# Patient Record
Sex: Female | Born: 1975 | Race: White | Hispanic: No | Marital: Married | Smoking: Never smoker
Health system: Southern US, Community
[De-identification: ages and names within clinical notes are randomized; demographics above are authoritative.]

## PROBLEM LIST (undated history)

## (undated) DIAGNOSIS — K649 Unspecified hemorrhoids: Secondary | ICD-10-CM

## (undated) HISTORY — PX: TONSILLECTOMY: SUR1361

## (undated) HISTORY — PX: BLADDER SURGERY: SHX569

---

## 2010-02-19 ENCOUNTER — Ambulatory Visit: Payer: Self-pay | Admitting: Internal Medicine

## 2012-05-26 ENCOUNTER — Observation Stay: Payer: Self-pay

## 2012-05-28 ENCOUNTER — Inpatient Hospital Stay: Payer: Self-pay | Admitting: Obstetrics and Gynecology

## 2012-05-28 LAB — CBC WITH DIFFERENTIAL/PLATELET
Basophil #: 0.2 10*3/uL — ABNORMAL HIGH (ref 0.0–0.1)
Basophil %: 2.2 %
Eosinophil #: 0 10*3/uL (ref 0.0–0.7)
Eosinophil %: 0.3 %
HCT: 32.9 % — ABNORMAL LOW (ref 35.0–47.0)
HGB: 10.9 g/dL — ABNORMAL LOW (ref 12.0–16.0)
Lymphocyte #: 1.5 10*3/uL (ref 1.0–3.6)
Lymphocyte %: 14.7 %
MCHC: 33.2 g/dL (ref 32.0–36.0)
Monocyte %: 4.8 %
Neutrophil %: 78 %
Platelet: 201 10*3/uL (ref 150–440)
WBC: 10.2 10*3/uL (ref 3.6–11.0)

## 2012-05-30 LAB — HEMOGLOBIN: HGB: 10.4 g/dL — ABNORMAL LOW (ref 12.0–16.0)

## 2012-08-19 ENCOUNTER — Ambulatory Visit: Payer: Self-pay | Admitting: Obstetrics and Gynecology

## 2012-08-19 LAB — HEMOGLOBIN: HGB: 12.6 g/dL (ref 12.0–16.0)

## 2012-08-19 LAB — URINALYSIS, COMPLETE
Glucose,UR: NEGATIVE mg/dL (ref 0–75)
Nitrite: NEGATIVE
RBC,UR: 63 /HPF (ref 0–5)
Specific Gravity: 1.018 (ref 1.003–1.030)
WBC UR: 13 /HPF (ref 0–5)

## 2012-08-23 ENCOUNTER — Ambulatory Visit: Payer: Self-pay | Admitting: Obstetrics and Gynecology

## 2012-08-23 LAB — PREGNANCY, URINE: Pregnancy Test, Urine: NEGATIVE m[IU]/mL

## 2012-08-28 LAB — WOUND CULTURE

## 2014-03-28 DIAGNOSIS — F32A Depression, unspecified: Secondary | ICD-10-CM | POA: Insufficient documentation

## 2014-04-11 ENCOUNTER — Ambulatory Visit: Payer: Self-pay

## 2015-04-02 DIAGNOSIS — N939 Abnormal uterine and vaginal bleeding, unspecified: Secondary | ICD-10-CM | POA: Insufficient documentation

## 2015-04-02 DIAGNOSIS — A630 Anogenital (venereal) warts: Secondary | ICD-10-CM | POA: Insufficient documentation

## 2015-04-17 NOTE — Op Note (Signed)
PATIENT NAME:  Kathy ScottsCUNIO, Myrl R MR#:  [id number] DATE OF BIRTH:  09/08/1976  DATE OF PROCEDURE:  08/23/2012  PREOPERATIVE DIAGNOSIS: Perineal abscess with sinus tract to right gluteus.   POSTOPERATIVE DIAGNOSIS: Perineal abscess with sinus tract to right gluteus.   PROCEDURE: Excision of abscess with sinus tract.   SURGEON: Ricky L. Logan BoresEvans, M.D.   ANESTHESIA: General orotracheal.   FINDINGS: Abscess with connection from just past the midline to the left side of the posterior fourchette connecting to an area of the right buttock approximately 5 cm from the midline.   ESTIMATED BLOOD LOSS: 50.   COMPLICATIONS: None.   SPECIMENS: Abscess including sinus tract.   DRAINS: None.   COMPLICATIONS: None.   PROCEDURE IN DETAIL: The patient had an area of the right gluteus that appeared to be an abscess. It was actually drained by Dr. Feliberto GottronSchermerhorn in May.  She persisted and now at several months postpartum she has continuous draining in this area. Pressure at the posterior fourchette leads to expression of pustular material through the right buttock wound. The entire length of the tract was approximately 7 cm. Discussed options and recommended excision in the operating room. Consent was signed. Preoperative antibiotics were given. She was taken to the operating room  and placed in the supine position where anesthesia was initiated and placed in the dorsal lithotomy position using Allen stirrups, prepped and draped in the usual sterile fashion. A #15 blade was used to make an approximate 4-mm vertical incision through the opening of the gluteal portion of the sinus and a duct probe was used to trace the tract to the left of midline.  Incision was made through the posterior vaginal perineal midline and carried down to the level of the probe. A pocket to the left of the probe was grasped at its base with a hemostat, everted, and excised with a scalpel. Oozing was controlled with cautery.  The sinus tract  from the midline to the right gluteus was then excised using Metzenbaum scissors and a #11 blade.  This was all sent as sinus tract/abscess. Excessive mattress sutures were placed to close the dead space and the posterior peritoneum was closed with running in the usual fashion, all using 3-0 Vicryl.  Dermabond was used to close the buttock incision.   All needle, instrument, and sponge counts were correct.  The patient tolerated the procedure well.  Hemostasis and cosmesis were good.  I anticipate a routine postoperative course. Cultures were taken and I will have the patient take doxycycline once a day for five more days.    ____________________________ Reatha Harpsicky L. Logan BoresEvans, MD rle:bjt D: 08/23/2012 09:42:54 ET T: 08/23/2012 11:26:34 ET JOB#: [id number]  cc: Clide Clifficky L. Logan BoresEvans, MD, <Dictator> Augustina MoodICK L Esaias Cleavenger MD ELECTRONICALLY SIGNED 08/24/2012 9:19

## 2015-10-06 ENCOUNTER — Emergency Department
Admission: EM | Admit: 2015-10-06 | Discharge: 2015-10-06 | Disposition: A | Discharge status: Home / Self Care | Payer: No Typology Code available for payment source | Attending: Emergency Medicine | Admitting: Emergency Medicine

## 2015-10-06 ENCOUNTER — Encounter: Payer: Self-pay | Admitting: Emergency Medicine

## 2015-10-06 DIAGNOSIS — H53149 Visual discomfort, unspecified: Secondary | ICD-10-CM | POA: Diagnosis not present

## 2015-10-06 DIAGNOSIS — R51 Headache: Secondary | ICD-10-CM | POA: Diagnosis present

## 2015-10-06 DIAGNOSIS — R519 Headache, unspecified: Secondary | ICD-10-CM

## 2015-10-06 HISTORY — DX: Unspecified hemorrhoids: K64.9

## 2015-10-06 LAB — CBC WITH DIFFERENTIAL/PLATELET
BASOS ABS: 0 10*3/uL (ref 0–0.1)
BASOS PCT: 0 %
EOS ABS: 0 10*3/uL (ref 0–0.7)
Eosinophils Relative: 0 %
HEMATOCRIT: 38.2 % (ref 35.0–47.0)
HEMOGLOBIN: 12.5 g/dL (ref 12.0–16.0)
Lymphocytes Relative: 17 %
Lymphs Abs: 2.1 10*3/uL (ref 1.0–3.6)
MCH: 27.6 pg (ref 26.0–34.0)
MCHC: 32.7 g/dL (ref 32.0–36.0)
MCV: 84.6 fL (ref 80.0–100.0)
MONO ABS: 0.6 10*3/uL (ref 0.2–0.9)
MONOS PCT: 5 %
NEUTROS ABS: 9.9 10*3/uL — AB (ref 1.4–6.5)
NEUTROS PCT: 78 %
Platelets: 301 10*3/uL (ref 150–440)
RBC: 4.52 MIL/uL (ref 3.80–5.20)
RDW: 14.4 % (ref 11.5–14.5)
WBC: 12.7 10*3/uL — ABNORMAL HIGH (ref 3.6–11.0)

## 2015-10-06 LAB — BASIC METABOLIC PANEL
ANION GAP: 6 (ref 5–15)
BUN: 13 mg/dL (ref 6–20)
CALCIUM: 8.8 mg/dL — AB (ref 8.9–10.3)
CO2: 26 mmol/L (ref 22–32)
CREATININE: 0.77 mg/dL (ref 0.44–1.00)
Chloride: 106 mmol/L (ref 101–111)
Glucose, Bld: 103 mg/dL — ABNORMAL HIGH (ref 65–99)
Potassium: 4 mmol/L (ref 3.5–5.1)
Sodium: 138 mmol/L (ref 135–145)

## 2015-10-06 MED ORDER — METOCLOPRAMIDE HCL 5 MG/ML IJ SOLN
10.0000 mg | Freq: Once | INTRAMUSCULAR | Status: AC
  Administered 2015-10-06: 10 mg via INTRAVENOUS
  Filled 2015-10-06: qty 2

## 2015-10-06 MED ORDER — KETOROLAC TROMETHAMINE 30 MG/ML IJ SOLN
30.0000 mg | Freq: Once | INTRAMUSCULAR | Status: AC
  Administered 2015-10-06: 30 mg via INTRAVENOUS
  Filled 2015-10-06: qty 1

## 2015-10-06 MED ORDER — SODIUM CHLORIDE 0.9 % IV BOLUS (SEPSIS)
1000.0000 mL | Freq: Once | INTRAVENOUS | Status: AC
  Administered 2015-10-06: 1000 mL via INTRAVENOUS

## 2015-10-06 MED ORDER — OXYCODONE-ACETAMINOPHEN 5-325 MG PO TABS
1.0000 | ORAL_TABLET | Freq: Once | ORAL | Status: AC
  Administered 2015-10-06: 1 via ORAL
  Filled 2015-10-06: qty 1

## 2015-10-06 NOTE — ED Provider Notes (Signed)
[facility] Emergency Department Provider Note   ____________________________________________  Time seen:  I have reviewed the triage vital signs and the triage nursing note.  HISTORY  Chief Complaint Influenza and Headache   Historian Patient and husband  HPI Kathy Webb is a 39 y.o. female who is here for evaluation of headache. She has had "migraines" before but usually they go away taking medication and resting at home in the dark. She woke up with a headache on Friday morning which was moderate and it seemed to intensify throughout the day and it now been there constantly since Friday morning. Pain is currently 6 out of 10. She is nauseated. She has had 6 episodes of vomiting over the past 24 hours. Patient works at a daycare. Patient had a flu shot on Thursday and then had nausea vomiting and diarrhea for 24 hours. Last vomiting was 3 AM this morning. She is having photophobia. Headache is global but mostly located behind the eyes in the frontal area. No neck stiffness. No fever. No weakness, numbness, confusion or altered mental status. No seizures.    Past Medical History  Diagnosis Date  . Hemorrhoids     There are no active problems to display for this patient.   History reviewed. No pertinent past surgical history.  No current outpatient prescriptions on file.  Allergies Claritin  History reviewed. No pertinent family history.  Social History Social History  Substance Use Topics  . Smoking status: Never Smoker   . Smokeless tobacco: Never Used  . Alcohol Use: Yes     Comment: OCcassionally    Review of Systems  Constitutional: Negative for fever. Eyes: Negative for visual changes. ENT: Negative for sore throat. Cardiovascular: Negative for chest pain. Respiratory: Negative for shortness of breath. Gastrointestinal: Negative for abdominal pain. Genitourinary: Negative for dysuria. Musculoskeletal: Negative for back  pain. Skin: Negative for rash. Neurological: Negative for altered mental status 10 point Review of Systems otherwise negative ____________________________________________   PHYSICAL EXAM:  VITAL SIGNS: ED Triage Vitals  Enc Vitals Group     BP 10/06/15 0913 122/77 mmHg     Pulse Rate 10/06/15 0913 89     Resp 10/06/15 0913 16     Temp 10/06/15 0913 98.1 F (36.7 C)     Temp Source 10/06/15 0913 Oral     SpO2 10/06/15 0913 97 %     Weight 10/06/15 0913 240 lb (108.863 kg)     Height 10/06/15 0913  (1.727 m)     Head Cir --      Peak Flow --      Pain Score 10/06/15 0921 10     Pain Loc --      Pain Edu? --      Excl. in GC? --      Constitutional: Alert and oriented. Well appearing and in no distress. Eyes: Conjunctivae are normal. PERRL. Normal extraocular movements. positive photophobia. Funduscopic exam limited due to pinpoint pupils, however visualize fundi normal. ENT   Head: Normocephalic and atraumatic.   Nose: No congestion/rhinnorhea.   Mouth/Throat: Mucous membranes are moist.   Neck: No stridor. Cardiovascular/Chest: Normal rate, regular rhythm.  No murmurs, rubs, or gallops. Respiratory: Normal respiratory effort without tachypnea nor retractions. Breath sounds are clear and equal bilaterally. No wheezes/rales/rhonchi. Gastrointestinal: Soft. No distention, no guarding, no rebound. Nontender   Genitourinary/rectal:Deferred Musculoskeletal: Nontender with normal range of motion in all extremities. No joint effusions.  No lower extremity tenderness.  No edema.  Neurologic:  Normal speech and language. No gross or focal neurologic deficits are appreciated. Skin:  Skin is warm, dry and intact. No rash noted. Psychiatric: Mood and affect are normal. Speech and behavior are normal. Patient exhibits appropriate insight and judgment.  ____________________________________________   EKG I, Governor Rooks, MD, the attending physician have personally  viewed and interpreted all ECGs.  No EKG performed ____________________________________________  LABS (pertinent positives/negatives)  White blood count 12.7, hemoglobin 12.5, platelet count 301 Basic metabolic panel without significant abnormality  ____________________________________________  RADIOLOGY All Xrays were viewed by me. Imaging interpreted by Radiologist.  none __________________________________________  PROCEDURES  Procedure(s) performed: None  Critical Care performed: None  ____________________________________________   ED COURSE / ASSESSMENT AND PLAN  CONSULTATIONS: None  Pertinent labs & imaging results that were available during my care of the patient were reviewed by me and considered in my medical decision making (see chart for details).   Patient is here with stable vital signs being evaluated for nonspecific headache. Clinical symptoms do not sound highly concerning for intracranial abnormality such as stroke, bleed, tumor, or subarachnoid hemorrhage. I discussed with the patient risks versus benefit of CT scan today, and we chose to hold off. We discussed that if she has recurrent or worsening headache, or certainly any neurologic deficit she is to return to the emergency department for continued evaluation and possible imaging. We discussed she should follow with primary care physician, and they may discuss MRI imaging if indicated at that point time.  ----------------------------------------- 3:46 PM on 10/06/2015 -----------------------------------------  Patient does have a minimally elevated white blood cell count, however she is not having any meningismus, I do not suspect meningitis. Her headache feels much better after symptomatic medications of Reglan, Toradol, and Zofran as well as IV fluids. Patient is stable for discharge and outpatient follow-up.  Patient / Family / Caregiver informed of clinical course, medical decision-making process,  and agree with plan.   I discussed return precautions, follow-up instructions, and discharged instructions with patient and/or family.  ___________________________________________   FINAL CLINICAL IMPRESSION(S) / ED DIAGNOSES   Final diagnoses:  Headache, unspecified headache type       Governor Rooks, MD 10/06/15 1547

## 2015-10-06 NOTE — Discharge Instructions (Signed)
You were evaluated for headache and no certain cause was found, however your exam and evaluation are reassuring. We discussed holding off on head CT today given your normal neurologic exam. Return to the emergency department for any worsening condition including worsening headache, weakness, numbness, fever, confusion or altered mental status, seizure, or any other symptoms concerning to you.   General Headache Without Cause A headache is pain or discomfort felt around the head or neck area. There are many causes and types of headaches. In some cases, the cause may not be found.  HOME CARE  Managing Pain  Take over-the-counter and prescription medicines only as told by your doctor.  Lie down in a dark, quiet room when you have a headache.  If directed, apply ice to the head and neck area:  Put ice in a plastic bag.  Place a towel between your skin and the bag.  Leave the ice on for 20 minutes, 2-3 times per day.  Use a heating pad or hot shower to apply heat to the head and neck area as told by your doctor.  Keep lights dim if bright lights bother you or make your headaches worse. Eating and Drinking  Eat meals on a regular schedule.  Lessen how much alcohol you drink.  Lessen how much caffeine you drink, or stop drinking caffeine. General Instructions  Keep all follow-up visits as told by your doctor. This is important.  Keep a journal to find out if certain things bring on headaches. For example, write down:  What you eat and drink.  How much sleep you get.  Any change to your diet or medicines.  Relax by getting a massage or doing other relaxing activities.  Lessen stress.  Sit up straight. Do not tighten (tense) your muscles.  Do not use tobacco products. This includes cigarettes, chewing tobacco, or e-cigarettes. If you need help quitting, ask your doctor.  Exercise regularly as told by your doctor.  Get enough sleep. This often means 7-9 hours of sleep. GET  HELP IF:  Your symptoms are not helped by medicine.  You have a headache that feels different than the other headaches.  You feel sick to your stomach (nauseous) or you throw up (vomit).  You have a fever. GET HELP RIGHT AWAY IF:   Your headache becomes really bad.  You keep throwing up.  You have a stiff neck.  You have trouble seeing.  You have trouble speaking.  You have pain in the eye or ear.  Your muscles are weak or you lose muscle control.  You lose your balance or have trouble walking.  You feel like you will pass out (faint) or you pass out.  You have confusion.   This information is not intended to replace advice given to you by your health care provider. Make sure you discuss any questions you have with your health care provider.   Document Released: 09/23/2008 Document Revised: 09/05/2015 Document Reviewed: 04/09/2015 Elsevier Interactive Patient Education [employer]

## 2015-10-06 NOTE — ED Notes (Signed)
Pt states she got the flu shot on Thursday, and on Friday pt had N/V/D and a HA. Pt states light sensitivity. Pt states 6 episodes of vomiting in last 24 hrs. Pt states she is unable to keep fluids or food down at this time. States she was unable to sleep due to the HA.

## 2015-10-06 NOTE — ED Notes (Signed)
Pt reports frontal headache for the past 2 days with light sensitivity . With increased pain when bending forward. Pt also reports getting her flu shot Thursday and nausea vomiting and diarrhea since yesterday. Last episode of vomiting at 0300 this morning.

## 2017-05-12 ENCOUNTER — Other Ambulatory Visit: Payer: Self-pay | Admitting: Obstetrics and Gynecology

## 2017-05-12 DIAGNOSIS — Z1231 Encounter for screening mammogram for malignant neoplasm of breast: Secondary | ICD-10-CM

## 2017-05-29 ENCOUNTER — Ambulatory Visit
Admission: RE | Admit: 2017-05-29 | Discharge: 2017-05-29 | Disposition: A | Discharge status: Home / Self Care | Payer: PRIVATE HEALTH INSURANCE | Source: Ambulatory Visit | Attending: Obstetrics and Gynecology | Admitting: Obstetrics and Gynecology

## 2017-05-29 DIAGNOSIS — Z1231 Encounter for screening mammogram for malignant neoplasm of breast: Secondary | ICD-10-CM | POA: Insufficient documentation

## 2017-06-03 DIAGNOSIS — G43109 Migraine with aura, not intractable, without status migrainosus: Secondary | ICD-10-CM | POA: Insufficient documentation

## 2017-07-02 ENCOUNTER — Emergency Department: Payer: PRIVATE HEALTH INSURANCE

## 2017-07-02 ENCOUNTER — Emergency Department
Admission: EM | Admit: 2017-07-02 | Discharge: 2017-07-02 | Disposition: A | Discharge status: Home / Self Care | Payer: PRIVATE HEALTH INSURANCE | Attending: Emergency Medicine | Admitting: Emergency Medicine

## 2017-07-02 ENCOUNTER — Encounter: Payer: Self-pay | Admitting: *Deleted

## 2017-07-02 DIAGNOSIS — K649 Unspecified hemorrhoids: Secondary | ICD-10-CM | POA: Diagnosis not present

## 2017-07-02 DIAGNOSIS — R197 Diarrhea, unspecified: Secondary | ICD-10-CM | POA: Diagnosis not present

## 2017-07-02 DIAGNOSIS — Z975 Presence of (intrauterine) contraceptive device: Secondary | ICD-10-CM | POA: Insufficient documentation

## 2017-07-02 DIAGNOSIS — Z79899 Other long term (current) drug therapy: Secondary | ICD-10-CM | POA: Insufficient documentation

## 2017-07-02 DIAGNOSIS — N83201 Unspecified ovarian cyst, right side: Secondary | ICD-10-CM | POA: Insufficient documentation

## 2017-07-02 DIAGNOSIS — N939 Abnormal uterine and vaginal bleeding, unspecified: Secondary | ICD-10-CM | POA: Diagnosis not present

## 2017-07-02 DIAGNOSIS — K922 Gastrointestinal hemorrhage, unspecified: Secondary | ICD-10-CM | POA: Insufficient documentation

## 2017-07-02 DIAGNOSIS — R102 Pelvic and perineal pain: Secondary | ICD-10-CM

## 2017-07-02 DIAGNOSIS — K625 Hemorrhage of anus and rectum: Secondary | ICD-10-CM | POA: Diagnosis present

## 2017-07-02 DIAGNOSIS — R1030 Lower abdominal pain, unspecified: Secondary | ICD-10-CM | POA: Insufficient documentation

## 2017-07-02 DIAGNOSIS — R112 Nausea with vomiting, unspecified: Secondary | ICD-10-CM | POA: Diagnosis not present

## 2017-07-02 LAB — URINALYSIS, COMPLETE (UACMP) WITH MICROSCOPIC
Bilirubin Urine: NEGATIVE
GLUCOSE, UA: NEGATIVE mg/dL
KETONES UR: NEGATIVE mg/dL
Nitrite: NEGATIVE
PH: 6 (ref 5.0–8.0)
Protein, ur: NEGATIVE mg/dL
Specific Gravity, Urine: 1.001 — ABNORMAL LOW (ref 1.005–1.030)

## 2017-07-02 LAB — CBC
HCT: 41.9 % (ref 35.0–47.0)
HEMOGLOBIN: 14.1 g/dL (ref 12.0–16.0)
MCH: 29 pg (ref 26.0–34.0)
MCHC: 33.7 g/dL (ref 32.0–36.0)
MCV: 86.1 fL (ref 80.0–100.0)
PLATELETS: 361 10*3/uL (ref 150–440)
RBC: 4.86 MIL/uL (ref 3.80–5.20)
RDW: 13.4 % (ref 11.5–14.5)
WBC: 17.3 10*3/uL — AB (ref 3.6–11.0)

## 2017-07-02 LAB — COMPREHENSIVE METABOLIC PANEL
ALT: 17 U/L (ref 14–54)
ANION GAP: 9 (ref 5–15)
AST: 20 U/L (ref 15–41)
Albumin: 4.2 g/dL (ref 3.5–5.0)
Alkaline Phosphatase: 73 U/L (ref 38–126)
BUN: 15 mg/dL (ref 6–20)
CHLORIDE: 104 mmol/L (ref 101–111)
CO2: 25 mmol/L (ref 22–32)
CREATININE: 0.79 mg/dL (ref 0.44–1.00)
Calcium: 9.4 mg/dL (ref 8.9–10.3)
Glucose, Bld: 118 mg/dL — ABNORMAL HIGH (ref 65–99)
POTASSIUM: 4.3 mmol/L (ref 3.5–5.1)
SODIUM: 138 mmol/L (ref 135–145)
Total Bilirubin: 0.7 mg/dL (ref 0.3–1.2)
Total Protein: 7.7 g/dL (ref 6.5–8.1)

## 2017-07-02 LAB — TYPE AND SCREEN
ABO/RH(D): A POS
Antibody Screen: NEGATIVE

## 2017-07-02 LAB — WET PREP, GENITAL
CLUE CELLS WET PREP: NONE SEEN
Sperm: NONE SEEN
Trich, Wet Prep: NONE SEEN
Yeast Wet Prep HPF POC: NONE SEEN

## 2017-07-02 LAB — CHLAMYDIA/NGC RT PCR (ARMC ONLY)
CHLAMYDIA TR: NOT DETECTED
N gonorrhoeae: NOT DETECTED

## 2017-07-02 LAB — POC URINE PREG, ED: PREG TEST UR: NEGATIVE

## 2017-07-02 MED ORDER — OXYCODONE-ACETAMINOPHEN 5-325 MG PO TABS: 1.0000 | ORAL_TABLET | Freq: Four times a day (QID) | ORAL | 0 refills | Status: DC | PRN

## 2017-07-02 MED ORDER — ONDANSETRON HCL 4 MG/2ML IJ SOLN
4.0000 mg | Freq: Once | INTRAMUSCULAR | Status: AC
  Administered 2017-07-02: 4 mg via INTRAVENOUS
  Filled 2017-07-02: qty 2

## 2017-07-02 MED ORDER — KETOROLAC TROMETHAMINE 30 MG/ML IJ SOLN
30.0000 mg | Freq: Once | INTRAMUSCULAR | Status: AC
  Administered 2017-07-02: 30 mg via INTRAVENOUS
  Filled 2017-07-02: qty 1

## 2017-07-02 MED ORDER — IOPAMIDOL (ISOVUE-300) INJECTION 61%
30.0000 mL | Freq: Once | INTRAVENOUS | Status: AC | PRN
  Administered 2017-07-02: 30 mL via ORAL

## 2017-07-02 MED ORDER — OXYCODONE-ACETAMINOPHEN 5-325 MG PO TABS
2.0000 | ORAL_TABLET | Freq: Once | ORAL | Status: DC
  Filled 2017-07-02: qty 2

## 2017-07-02 MED ORDER — SODIUM CHLORIDE 0.9 % IV BOLUS (SEPSIS)
1000.0000 mL | Freq: Once | INTRAVENOUS | Status: AC
  Administered 2017-07-02: 1000 mL via INTRAVENOUS

## 2017-07-02 MED ORDER — HYDROCORTISONE ACETATE 25 MG RE SUPP: 25.0000 mg | Freq: Two times a day (BID) | RECTAL | 1 refills | Status: AC

## 2017-07-02 MED ORDER — IOPAMIDOL (ISOVUE-300) INJECTION 61%
100.0000 mL | Freq: Once | INTRAVENOUS | Status: AC | PRN
  Administered 2017-07-02: 100 mL via INTRAVENOUS

## 2017-07-02 NOTE — ED Provider Notes (Signed)
Labs Reviewed  WET PREP, GENITAL - Abnormal; Notable for the following:       Result Value   WBC, Wet Prep HPF POC FEW (*)    All other components within normal limits  COMPREHENSIVE METABOLIC PANEL - Abnormal; Notable for the following:    Glucose, Bld 118 (*)    All other components within normal limits  CBC - Abnormal; Notable for the following:    WBC 17.3 (*)    All other components within normal limits  URINALYSIS, COMPLETE (UACMP) WITH MICROSCOPIC - Abnormal; Notable for the following:    Color, Urine COLORLESS (*)    APPearance CLEAR (*)    Specific Gravity, Urine 1.001 (*)    Hgb urine dipstick MODERATE (*)    Leukocytes, UA SMALL (*)    Bacteria, UA RARE (*)    Squamous Epithelial / LPF 0-5 (*)    All other components within normal limits  CHLAMYDIA/NGC RT PCR ([facility] ONLY)  GASTROINTESTINAL PANEL BY PCR, STOOL (REPLACES STOOL CULTURE)  C DIFFICILE QUICK SCREEN W PCR REFLEX  POC URINE PREG, ED  POC OCCULT BLOOD, ED  TYPE AND SCREEN   Vitals:   07/02/17 1730 07/02/17 1754  BP: (!) 126/92   Pulse: 65 72  Resp:    Temp:     IMPRESSION: 5.9 cm simple appearing right ovarian cyst without free fluid or evidence of ovarian torsion. This is almost certainly benign, but follow up ultrasound is recommended in 1 year according to the Society of Radiologists in Ultrasound 2010 Consensus Conference Statement (D Lenis NoonLevine et al. Management of Asymptomatic Ovarian and Other Adnexal Cysts Imaged at US: Society of Radiologists in Ultrasound Consensus Conference Statement 2010. Radiology 256 (Sept 2010): 943-954.).  IUD which appears in appropriate position.  No other significant abnormalities. Patient will be discharged with pain medicine, Anusol HC suppositories. She'll be referred to Dr. Renda RollsWilton Smith for outpatient follow-up.    Emily FilbertWilliams, Jonathan E, MD 07/02/17 [phone]

## 2017-07-02 NOTE — ED Triage Notes (Signed)
Pt reports having a new onset of bright red rectal bleeding. Pt reports beginning yesterday she has started passing "blood and tissue" Pt reports she had a hemorrhoid that burst last week but the bleeding had stopped until yesterday. Pt also reports having groin pain this week with nausea and vomiting yesterday. Pt has hx of fissures and hemorrhoids as well as polyps removed.   Pt also reports having been dx with UTI last week but is still felling sore and having increased urination. No fevers reported but pt verbalized having chills.

## 2017-07-02 NOTE — ED Notes (Signed)
[employer] not allowing this nurse to print labels for urine specimen.  Lab notified and okayed to send with white label.

## 2017-07-02 NOTE — ED Triage Notes (Signed)
FIRST NURSE NOTE-ambulatory to triage. Skin color WNL. NAD

## 2017-07-02 NOTE — ED Provider Notes (Addendum)
[facility] Emergency Department Provider Note  ____________________________________________   First MD Initiated Contact with Patient 07/02/17 1246     (approximate)  I have reviewed the triage vital signs and the nursing notes.   HISTORY  Chief Complaint Hemorrhoids   HPI Kathy Webb is a 41 y.o. female with a history of hemorrhoids who is presenting with blood both rectally and vaginally over the past 2 weeks. She is also a lower abdominal pain which she says is moderate and cramping. That she has had a long history of hemorrhoids that have required banding at times. However, last week she noticed a protruding hemorrhoid which is likely an internal hemorrhoid. She notes noticing blood on her toilet tissue that is progressed to what is now watery stools with blood mixed in. She denies having to push hard to move her bowels. She says that she is also noticed bleeding from the vagina but says that she has infrequent periods because of an IUD and thinks that this could possibly be her period. Says that the lower abdominal pain also radiates to the low back. Denies any fever. Also complaining of nausea and vomiting with 2 episodes last night. Also currently on Augmentin for UTI. Patient reports greater than 20 episodes of loose stool over the past 24 hours.    Past Medical History:  Diagnosis Date  . Hemorrhoids     There are no active problems to display for this patient.   History reviewed. No pertinent surgical history.  Prior to Admission medications   Medication Sig Start Date End Date Taking? Authorizing Provider  amoxicillin-clavulanate (AUGMENTIN) 875-125 MG tablet Take 1 tablet by mouth 2 (two) times daily. 06/22/17 07/03/17 Yes [provider]  escitalopram (LEXAPRO) 10 MG tablet Take 10 mg by mouth at bedtime.   Yes [provider]  nortriptyline (PAMELOR) 10 MG capsule Take 20 mg by mouth at bedtime as needed. 06/03/17  Yes  [provider]    Allergies Claritin [loratadine]  Family History  Problem Relation Age of Onset  . Breast cancer Paternal Grandmother        72's had mastectomy. possibly for precancerous    Social History Social History  Substance Use Topics  . Smoking status: Never Smoker  . Smokeless tobacco: Never Used  . Alcohol use Yes     Comment: OCcassionally    Review of Systems  Constitutional: No fever/chills Eyes: No visual changes. ENT: No sore throat. Cardiovascular: Denies chest pain. Respiratory: Denies shortness of breath. Gastrointestinal:  As above.  No constipation. Genitourinary: Negative for dysuria. Musculoskeletal: as above Skin: Negative for rash. Neurological: Negative for headaches, focal weakness or numbness.   ____________________________________________   PHYSICAL EXAM:  VITAL SIGNS: ED Triage Vitals [07/02/17 1145]  Enc Vitals Group     BP (!) 140/92     Pulse Rate 91     Resp 16     Temp 99 F (37.2 C)     Temp Source Oral     SpO2 99 %     Weight 235 lb (106.6 kg)     Height 5\' 8"  (1.727 m)     Head Circumference      Peak Flow      Pain Score 6     Pain Loc      Pain Edu?      Excl. in GC?     Constitutional: Alert and oriented. Well appearing and in no acute distress. Eyes: Conjunctivae are normal.  Head: Atraumatic. Nose: No congestion/rhinnorhea. Mouth/Throat: Mucous membranes are moist.  Neck: No stridor.   Cardiovascular: Normal rate, regular rhythm. Grossly normal heart sounds.   Respiratory: Normal respiratory effort.  No retractions. Lungs CTAB. Gastrointestinal: Soft With mild-to-moderate tenderness to palpation across the lower abdomen without any rebound or guarding. No distention. No CVA tenderness. External rectal exam with multiple non-engorged hemorrhoids. No fissures visualized. Genitourinary:  Normal gross examination without any lesions. Speculum exam with an appropriate amount of cervical mucus with  a very small amount of maroon blood mixed in. Strings are visualized from the cervix consistent with IUD strings.  Bimanual exam without cervical motion tenderness. No uterine nor adnexal tenderness nor masses. Musculoskeletal: No lower extremity tenderness nor edema.  No joint effusions. Neurologic:  Normal speech and language. No gross focal neurologic deficits are appreciated. Skin:  Skin is warm, dry and intact. No rash noted. Psychiatric: Mood and affect are normal. Speech and behavior are normal.  ____________________________________________   LABS (all labs ordered are listed, but only abnormal results are displayed)  Labs Reviewed  COMPREHENSIVE METABOLIC PANEL - Abnormal; Notable for the following:       Result Value   Glucose, Bld 118 (*)    All other components within normal limits  CBC - Abnormal; Notable for the following:    WBC 17.3 (*)    All other components within normal limits  WET PREP, GENITAL  CHLAMYDIA/NGC RT PCR ([facility] ONLY)  URINALYSIS, COMPLETE (UACMP) WITH MICROSCOPIC  POC OCCULT BLOOD, ED  POC URINE PREG, ED  TYPE AND SCREEN   ____________________________________________  EKG   ____________________________________________  RADIOLOGY  Pending CT the abdomen and pelvis ____________________________________________   PROCEDURES  Procedure(s) performed:   Procedures  Critical Care performed:   ____________________________________________   INITIAL IMPRESSION / ASSESSMENT AND PLAN / ED COURSE  Pertinent labs & imaging results that were available during my care of the patient were reviewed by me and considered in my medical decision making (see chart for details).  ----------------------------------------- 3:04 PM on 07/02/2017 -----------------------------------------  Patient pending CAT scan at this time. Tolerating by mouth contrast well. No further episodes of diarrhea or rectal bleeding so far in the emergency department.  Signed out to Dr. Mayford KnifeWilliams.  Lower GI bleeding seems to be consistent with hemorrhoidal bleed. Normal hemoglobin.      ____________________________________________   FINAL CLINICAL IMPRESSION(S) / ED DIAGNOSES  Abdominal pain. Vaginal bleeding. GI bleeding. Hemorrhoids.    NEW MEDICATIONS STARTED DURING THIS VISIT:  New Prescriptions   No medications on file     Note:  This document was prepared using Dragon voice recognition software and may include unintentional dictation errors.     Myrna BlazerSchaevitz, David Matthew, MD 07/02/17 1505    Pershing ProudSchaevitz, Myra Rudeavid Matthew, MD 07/02/17 [phone]

## 2017-07-02 NOTE — ED Notes (Signed)
Patient states drove herself and doesn't have another ride.  Refusing narcotic pain medicine.

## 2017-11-16 IMAGING — CT CT ABD-PELV W/ CM
2 of 5 series · 15 of 46 positions shown, 17 images · IV contrast (APPLIED)
Comparison: None.

CLINICAL DATA: Pt reports she had a hemorrhoid that burst last week
but the bleeding had stopped until yesterday. Pt also reports having
groin pain this week with nausea and vomiting yesterday. Pt has hx
of fissures and hemorrhoids as well as polyps removed. Pt also
reports having been dx with UTI last week but is still feeling sore
and having increased urination.

EXAM:
CT ABDOMEN AND PELVIS WITH CONTRAST
TECHNIQUE: Multidetector CT imaging of the abdomen and pelvis was performed
using the standard protocol following bolus administration of
intravenous contrast.
CONTRAST:  100mL Q7QH9C-ZAA IOPAMIDOL ([id number]-ZAA) INJECTION 61%

[Series 2: routine abd/pel with · axial · 0.95mm/px · z∈[-502,-72]mm · 12 of 98 slices shown, 14 images]
[im 6/98  soft-tissue]
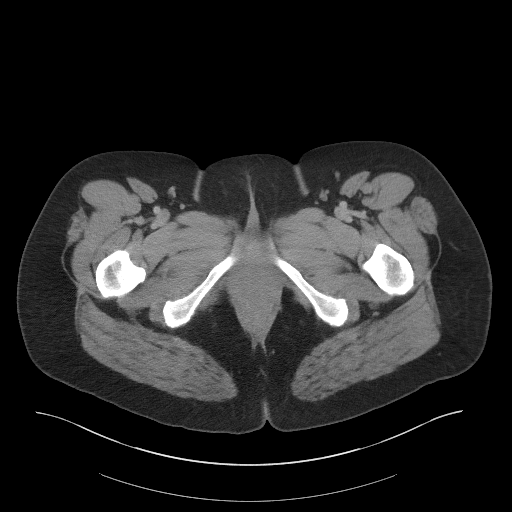
[im 6/98  bone]
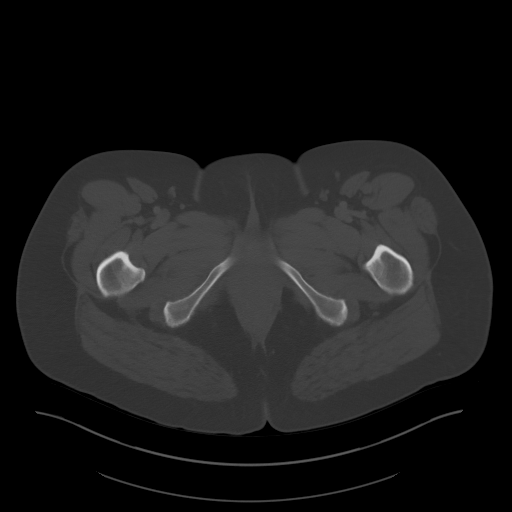
[im 16/98  soft-tissue]
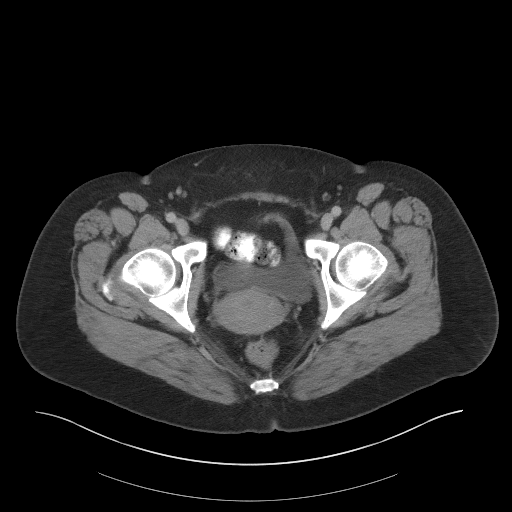
[im 21/98  soft-tissue]
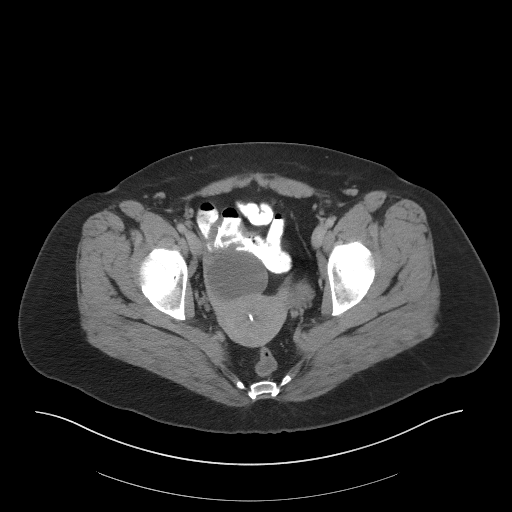
[im 31/98  soft-tissue]
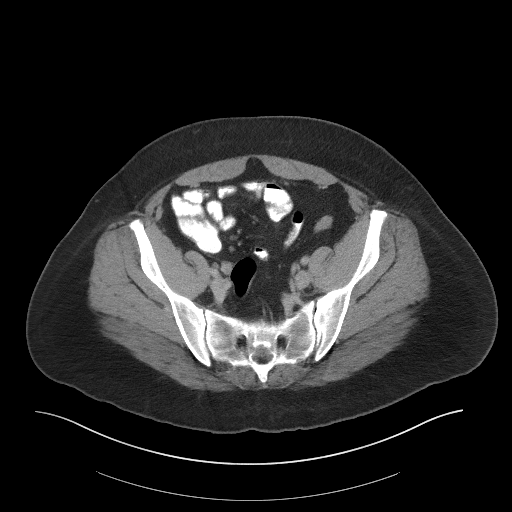
[im 36/98  soft-tissue]
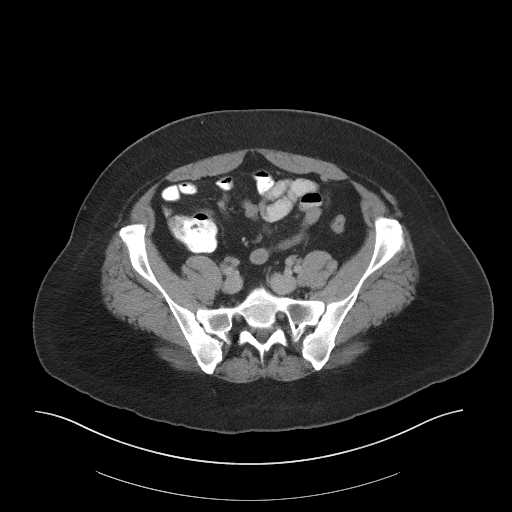
[im 46/98  soft-tissue]
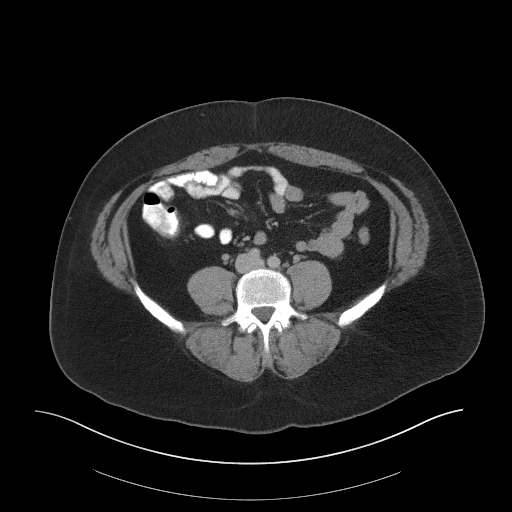
[im 52/98  soft-tissue]
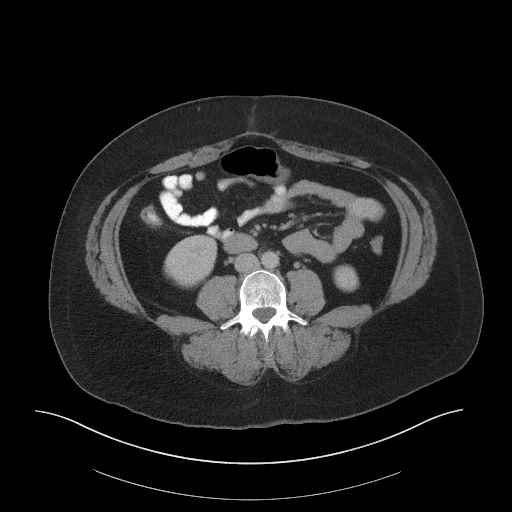
[im 62/98  soft-tissue]
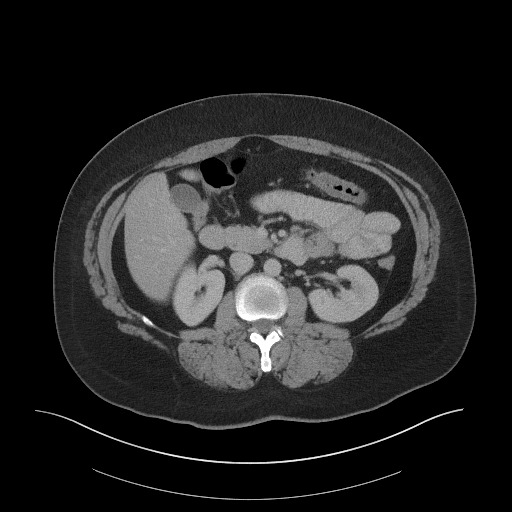
[im 67/98  soft-tissue]
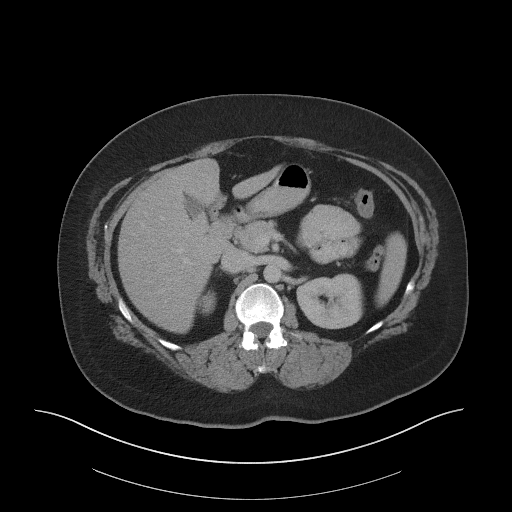
[im 67/98  bone]
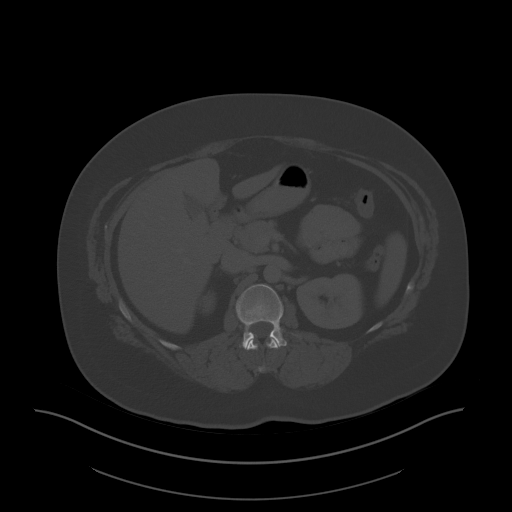
[im 77/98  soft-tissue]
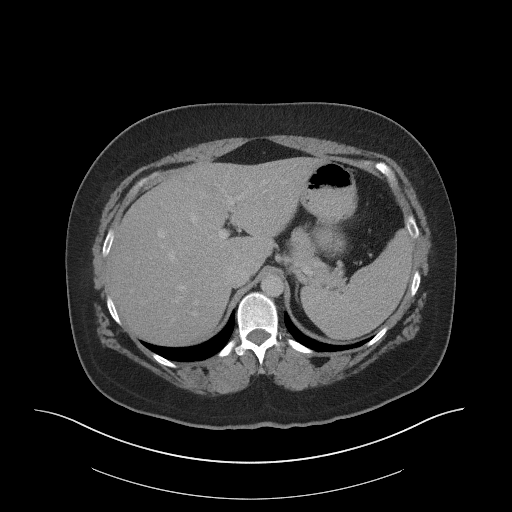
[im 82/98  soft-tissue]
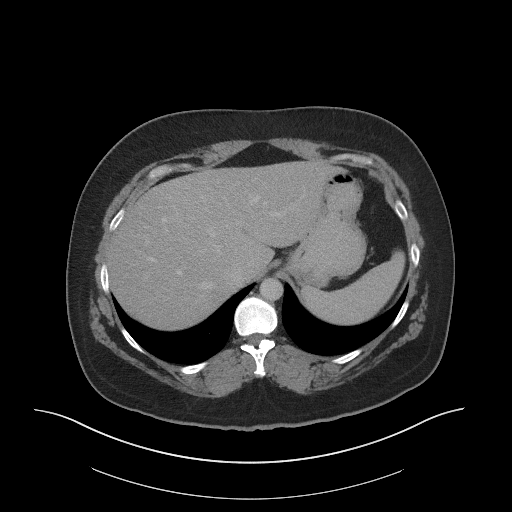
[im 92/98  soft-tissue]
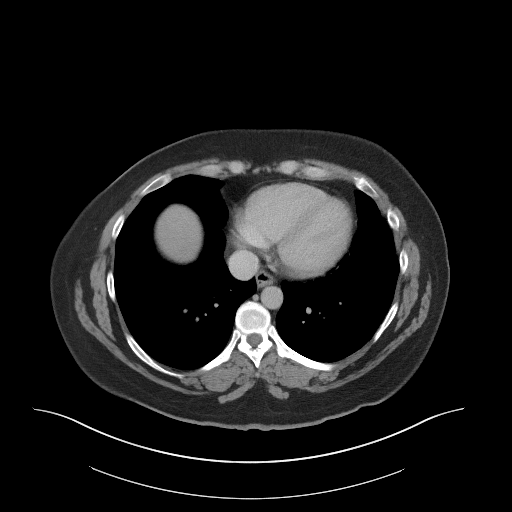

[Series 5: coronal st · coronal · 0.68mm/px · 3 of 101 slices shown]
[im 34/101  soft-tissue]
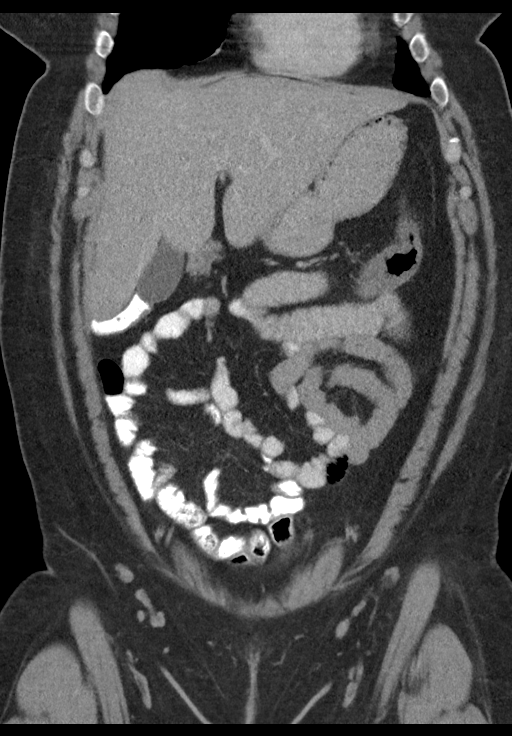
[im 45/101  soft-tissue]
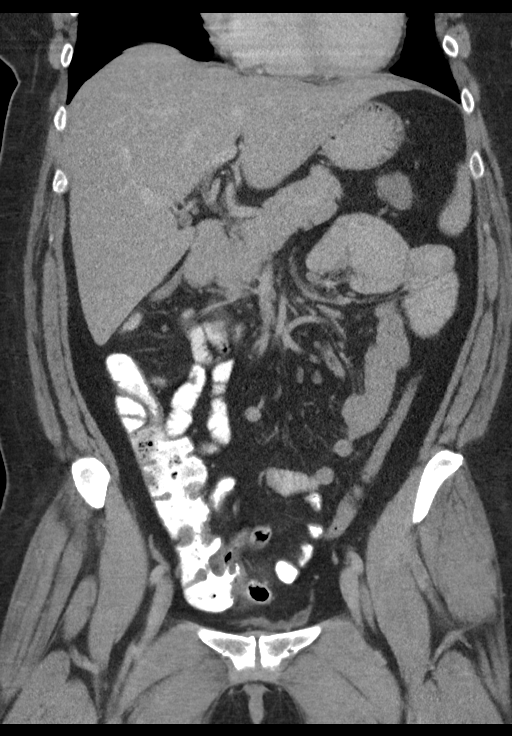
[im 56/101  soft-tissue]
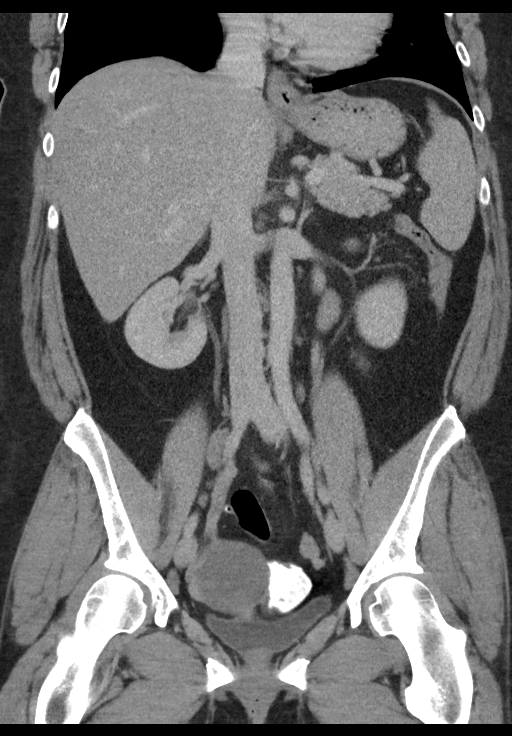

[15 of 46 positions shown; findings below may reference images not displayed]

FINDINGS: Lower chest: No acute abnormality.

Hepatobiliary: No focal liver abnormality is seen. No gallstones,
gallbladder wall thickening, or biliary dilatation.

Pancreas: Unremarkable. No pancreatic ductal dilatation or
surrounding inflammatory changes.

Spleen: Normal in size without focal abnormality.

Adrenals/Urinary Tract: Adrenal glands are normal in appearance.
Kidneys are symmetric in enhancement and excretion. No
hydronephrosis or obstruction.

Stomach/Bowel: The stomach and small bowel loops are normal in
appearance. The colon, rectum, and anus have a normal CT appearance.
No perirectal mass or fluid collection. The appendix is well seen
and has a normal appearance.

Vascular/Lymphatic: No significant vascular findings are present. No
enlarged abdominal or pelvic lymph nodes.

Reproductive: Intrauterine device is identified in the expected
location and position within the central uterus. A right adnexal
low-attenuation lesion is 5.6 x 4.6 x 6.0 cm, likely representing an
ovarian process. Region of the left ovary has a normal CT
appearance.

Other: No free pelvic fluid. Small fat containing paraumbilical
hernia.

Musculoskeletal: No acute or significant osseous findings.
IMPRESSION: 1. No contour deforming rectal mass or fluid collection.
2. Right adnexal low-attenuation mass measuring 5.6 cm warranting
further evaluation. Ultrasound of the pelvis is recommended.
3. No evidence for urinary tract obstruction or mass.
4. Small fat containing paraumbilical hernia.
5. Normal appendix.
6. Normal appearance of intrauterine device.

## 2018-11-15 DIAGNOSIS — R519 Headache, unspecified: Secondary | ICD-10-CM | POA: Insufficient documentation

## 2019-05-13 ENCOUNTER — Ambulatory Visit
Admission: EM | Admit: 2019-05-13 | Discharge: 2019-05-13 | Disposition: A | Discharge status: Home / Self Care | Payer: Medicaid Other | Attending: Family Medicine | Admitting: Family Medicine

## 2019-05-13 DIAGNOSIS — Y93H2 Activity, gardening and landscaping: Secondary | ICD-10-CM | POA: Diagnosis not present

## 2019-05-13 DIAGNOSIS — L255 Unspecified contact dermatitis due to plants, except food: Secondary | ICD-10-CM | POA: Diagnosis not present

## 2019-05-13 MED ORDER — PREDNISONE 10 MG PO TABS: ORAL_TABLET | ORAL | 0 refills | Status: DC

## 2019-05-13 MED ORDER — HYDROXYZINE HCL 25 MG PO TABS: 25.0000 mg | ORAL_TABLET | Freq: Three times a day (TID) | ORAL | 0 refills | Status: DC | PRN

## 2019-05-13 NOTE — ED Provider Notes (Signed)
[facility]    CSN: [id number] Arrival date & time: 05/13/19  1132  History   Chief Complaint Chief Complaint  Patient presents with  . Rash   HPI  43 year old female presents with rash.  Patient reports that she was recently weed eating.  Developed rash on Saturday.  Located on the trunk as well as the right upper arm.  She is tried several over-the-counter topical agents without resolution.  She believes that she has poison ivy.  Very itchy.  No known exacerbating factors.  Denies pain.  No drainage.  She does note redness.  No other associated symptoms.  No other complaints.  PMH, Surgical Hx, Family Hx, Social History reviewed and updated as below.  PMH: Depression    Tourette syndrome    Migraines    Varicella    Hearing loss, bilateral    History of rectal fissure  with bright red blood per rectum  Perirectal abscess    Obesity     Surgical Hx: TONSILLECTOMY      ADENOIDECTOMY      HEMORRHOIDECTOMY   DR. Katrinka Blazing   PERINEAL ABSCESS I&D   X 2   EXCISIONAL PROCEDURE   X2, DR. EVANS     Home Medications    Prior to Admission medications   Medication Sig Start Date End Date Taking? Authorizing Provider  escitalopram (LEXAPRO) 10 MG tablet Take 10 mg by mouth at bedtime.   Yes [provider]  nortriptyline (PAMELOR) 10 MG capsule Take 20 mg by mouth at bedtime as needed. 06/03/17  Yes [provider]  hydrOXYzine (ATARAX/VISTARIL) 25 MG tablet Take 1 tablet (25 mg total) by mouth every 8 (eight) hours as needed for itching. 05/13/19   Tommie Sams, DO  oxyCODONE-acetaminophen (PERCOCET) 5-325 MG tablet Take 1-2 tablets by mouth every 6 (six) hours as needed. 07/02/17   Emily Filbert, MD  predniSONE (DELTASONE) 10 MG tablet 50 mg daily x 3 days, then 40 mg daily x 3 days, then 30 mg daily x 3 days, then 20 mg daily x 3 days, then 10 mg daily x 3 days. 05/13/19   Tommie Sams, DO    Family History Family  History  Problem Relation Age of Onset  . Breast cancer Paternal Grandmother        6's had mastectomy. possibly for precancerous  . Healthy Mother   . Healthy Father     Social History Social History   Tobacco Use  . Smoking status: Never Smoker  . Smokeless tobacco: Never Used  Substance Use Topics  . Alcohol use: Yes    Comment: OCcassionally  . Drug use: No     Allergies   Claritin [loratadine]   Review of Systems Review of Systems  Constitutional: Negative.   Skin: Positive for rash.   Physical Exam Triage Vital Signs ED Triage Vitals  Enc Vitals Group     BP 05/13/19 1145 132/83     Pulse Rate 05/13/19 1145 94     Resp 05/13/19 1145 18     Temp 05/13/19 1145 98.8 F (37.1 C)     Temp Source 05/13/19 1145 Oral     SpO2 05/13/19 1145 98 %     Weight 05/13/19 1147 230 lb (104.3 kg)     Height --      Head Circumference --      Peak Flow --      Pain Score 05/13/19 1147 0     Pain Loc --  Pain Edu? --      Excl. in GC? --    Updated Vital Signs BP 132/83 (BP Location: Left Arm)   Pulse 94   Temp 98.8 F (37.1 C) (Oral)   Resp 18   Wt 104.3 kg   SpO2 98%   BMI 34.97 kg/m   Visual Acuity Right Eye Distance:   Left Eye Distance:   Bilateral Distance:    Right Eye Near:   Left Eye Near:    Bilateral Near:     Physical Exam Vitals signs and nursing note reviewed.  Constitutional:      Appearance: Normal appearance. She is obese.  HENT:     Head: Normocephalic and atraumatic.  Eyes:     General:        Right eye: No discharge.        Left eye: No discharge.     Conjunctiva/sclera: Conjunctivae normal.  Pulmonary:     Effort: Pulmonary effort is normal. No respiratory distress.  Skin:    Comments: Abdomen with diffuse erythematous, vesicular rash.  Patient also has an area in the antecubital fossa of the right arm.  Neurological:     Mental Status: She is alert.  Psychiatric:        Mood and Affect: Mood normal.        Behavior:  Behavior normal.    UC Treatments / Results  Labs (all labs ordered are listed, but only abnormal results are displayed) Labs Reviewed - No data to display  EKG None  Radiology No results found.  Procedures Procedures (including critical care time)  Medications Ordered in UC Medications - No data to display  Initial Impression / Assessment and Plan / UC Course  I have reviewed the triage vital signs and the nursing notes.  Pertinent labs & imaging results that were available during my care of the patient were reviewed by me and considered in my medical decision making (see chart for details).    43 year old female presents with dermatitis secondary to poison oak or poison ivy.  Treating with prednisone and Atarax.  Final Clinical Impressions(s) / UC Diagnoses   Final diagnoses:  Dermatitis due to plants, including poison ivy, sumac, and oak   Discharge Instructions   None    ED Prescriptions    Medication Sig Dispense Auth. Provider   predniSONE (DELTASONE) 10 MG tablet 50 mg daily x 3 days, then 40 mg daily x 3 days, then 30 mg daily x 3 days, then 20 mg daily x 3 days, then 10 mg daily x 3 days. 45 tablet Ladell Lea G, DO   hydrOXYzine (ATARAX/VISTARIL) 25 MG tablet Take 1 tablet (25 mg total) by mouth every 8 (eight) hours as needed for itching. 30 tablet Tommie Samsook, Ashton Belote G, DO     Controlled Substance Prescriptions [place] Controlled Substance Registry consulted? Not Applicable   Tommie SamsCook, Keynan Heffern G, DO 05/13/19 1240

## 2019-05-13 NOTE — ED Triage Notes (Signed)
Pt here for rash on her right abdomen for 1 week that is slowly spreading. Has a small amount of her right arm. Does itch and has tried technue on it.

## 2019-05-25 ENCOUNTER — Other Ambulatory Visit: Payer: Self-pay | Admitting: Obstetrics & Gynecology

## 2019-05-25 DIAGNOSIS — Z1231 Encounter for screening mammogram for malignant neoplasm of breast: Secondary | ICD-10-CM

## 2019-07-10 ENCOUNTER — Other Ambulatory Visit: Payer: Self-pay

## 2019-07-10 ENCOUNTER — Ambulatory Visit
Admission: EM | Admit: 2019-07-10 | Discharge: 2019-07-10 | Disposition: A | Discharge status: Home / Self Care | Payer: Medicaid Other | Attending: Urgent Care | Admitting: Urgent Care

## 2019-07-10 ENCOUNTER — Encounter: Payer: Self-pay | Admitting: Emergency Medicine

## 2019-07-10 DIAGNOSIS — N342 Other urethritis: Secondary | ICD-10-CM | POA: Diagnosis present

## 2019-07-10 LAB — URINALYSIS, COMPLETE (UACMP) WITH MICROSCOPIC
Bacteria, UA: NONE SEEN
Glucose, UA: NEGATIVE mg/dL
Leukocytes,Ua: NEGATIVE
Nitrite: NEGATIVE
Protein, ur: 30 mg/dL — AB
Specific Gravity, Urine: 1.025 (ref 1.005–1.030)
pH: 6.5 (ref 5.0–8.0)

## 2019-07-10 MED ORDER — PHENAZOPYRIDINE HCL 200 MG PO TABS: 200.0000 mg | ORAL_TABLET | Freq: Three times a day (TID) | ORAL | 0 refills | Status: DC | PRN

## 2019-07-10 MED ORDER — SULFAMETHOXAZOLE-TRIMETHOPRIM 800-160 MG PO TABS: 1.0000 | ORAL_TABLET | Freq: Two times a day (BID) | ORAL | 0 refills | Status: AC

## 2019-07-10 NOTE — ED Provider Notes (Signed)
[place]   Name: Kathy Webb DOB: [birth year] MRN: [id number] CSN: [id number] PCP: Idelle Crouch, MD  Arrival date and time:  07/10/19 1054  Chief Complaint:  Dysuria and Abdominal Pain   NOTE: Prior to seeing the patient today, I have reviewed the triage nursing documentation and vital signs. Clinical staff has updated patient's PMH/PSHx, current medication list, and drug allergies/intolerances to ensure comprehensive history available to assist in medical decision making.   History:   HPI: Kathy Webb is a 43 y.o. female who presents today with complaints of urinary symptoms that began with approximately 2 weeks ago.  She complains of dysuria with no associated frequency or urgency. Patient describes most of her pain as being at the urinary meatus. She has not appreciated any gross hematuria, nor has she noticed her urine being malodorous. Patient has been nauseated. She denies vomiting, fever, and chills. She has experienced pain in her lower back and suprapubic area. Patient states, "the pressure has been so bad". Over the course of the last week, patient notes that she has become weak feeling. She has been having frequent headaches.  Patient denies that she does not have a significant history of recurrent urinary tract infections. She has never had kidney stones. She denies any vaginal pain, bleeding, or discharge. LMP unknown; has IUD. She has no concerns of STIs.  In efforts to conservatively manage her symptoms at home, the patient notes that she has used ibuprofen, which has helped to improve her symptoms "some".    Past Medical History:  Diagnosis Date  . Hemorrhoids     Past Surgical History:  Procedure Laterality Date  . TONSILLECTOMY      Family History  Problem Relation Age of Onset  . Breast cancer Paternal Grandmother        70's had mastectomy. possibly for precancerous  . Healthy Mother   . Healthy Father     Social History   Tobacco Use  .  Smoking status: Never Smoker  . Smokeless tobacco: Never Used  Substance Use Topics  . Alcohol use: Yes    Comment: OCcassionally  . Drug use: No    There are no active problems to display for this patient.   Home Medications:    Current Meds  Medication Sig  . escitalopram (LEXAPRO) 10 MG tablet Take 10 mg by mouth at bedtime.  Marland Kitchen levonorgestrel (MIRENA) 20 MCG/24HR IUD 1 each by Intrauterine route once.  . nortriptyline (PAMELOR) 10 MG capsule Take 20 mg by mouth at bedtime as needed.    Allergies:   Claritin [loratadine]  Review of Systems (ROS): Review of Systems  Constitutional: Negative for chills and fever.  HENT: Negative.   Respiratory: Negative for cough and shortness of breath.   Cardiovascular: Negative for chest pain and palpitations.  Gastrointestinal: Positive for abdominal pain, constipation and nausea. Negative for diarrhea and vomiting.  Genitourinary: Positive for dysuria. Negative for flank pain, frequency, pelvic pain, vaginal bleeding, vaginal discharge and vaginal pain.  Musculoskeletal: Positive for back pain.  Skin: Negative for color change, pallor and rash.  Neurological: Positive for weakness (generalized) and headaches. Negative for dizziness.  Hematological: Negative for adenopathy.     Vital Signs: Today's Vitals   07/10/19 1135 07/10/19 1136 07/10/19 1139 07/10/19 1225  BP:   124/89   Pulse:   87   Resp:   16   Temp:   98 F (36.7 C)   TempSrc:   Oral   SpO2:  100%   Weight:  230 lb (104.3 kg)    Height:  5\' 9"  (1.753 m)    PainSc: 8    0-No pain    Physical Exam: Physical Exam  Constitutional: She is oriented to person, place, and time and well-developed, well-nourished, and in no distress.  HENT:  Head: Normocephalic and atraumatic.  Mouth/Throat: Mucous membranes are normal.  Eyes: Pupils are equal, round, and reactive to light. EOM are normal.  Neck: Normal range of motion. Neck supple. No tracheal deviation present.   Cardiovascular: Normal rate, regular rhythm, normal heart sounds and intact distal pulses. Exam reveals no gallop and no friction rub.  No murmur heard. Pulmonary/Chest: Effort normal and breath sounds normal. No respiratory distress. She has no wheezes. She has no rales.  Abdominal: Soft. Normal appearance and bowel sounds are normal. There is no hepatosplenomegaly. There is abdominal tenderness in the suprapubic area. There is no CVA tenderness.  Lymphadenopathy:    She has no cervical adenopathy.  Neurological: She is alert and oriented to person, place, and time. Gait normal. GCS score is 15.  Skin: Skin is warm and dry. No rash noted.  Psychiatric: Mood, memory, affect and judgment normal.  Nursing note and vitals reviewed.   Urgent Care Treatments / Results:   LABS: PLEASE NOTE: all labs that were ordered this encounter are listed, however only abnormal results are displayed. Labs Reviewed  URINALYSIS, COMPLETE (UACMP) WITH MICROSCOPIC - Abnormal; Notable for the following components:      Result Value   APPearance HAZY (*)    Hgb urine dipstick MODERATE (*)    Bilirubin Urine SMALL (*)    Ketones, ur TRACE (*)    Protein, ur 30 (*)    All other components within normal limits  URINE CULTURE    EKG: -None  RADIOLOGY: No results found.  PROCEDURES: Procedures  MEDICATIONS RECEIVED THIS VISIT: Medications - No data to display  PERTINENT CLINICAL COURSE NOTES/UPDATES:   Initial Impression / Assessment and Plan / Urgent Care Course:  Pertinent labs & imaging results that were available during my care of the patient were personally reviewed by me and considered in my medical decision making (see lab/imaging section of note for values and interpretations).  Kathy Webb is a 43 y.o. female who [facility] today with complaints of Dysuria and Abdominal Pain  Patient overall well appearing and in no acute distress today in clinic. Exam reveals  suprapubic abdominal tenderness. She denies CVAT. No fevers. UA negative for obvious infection; reflex culture sent. No concerns for STI. Given pain at the urinary meatus, her symptoms are most consistent with urethritis. The possibility of early urinary tract infection is also a consideration. Will prescribe some pyridium to help promote symptomatic relief. Patient encouraged to increase fluid intake to flush tract. Will provide a provisional prescription for a 3 day course of SMZ-TMP DS for patient to use if she is still having significant symptoms in 2-3 days. May continue to use Tylenol and/or Ibuprofen as needed for pain.  Discussed follow up with primary care physician in 1 week for re-evaluation. I have reviewed the follow up and strict return precautions for any new or worsening symptoms. Patient is aware of symptoms that would be deemed urgent/emergent, and would thus require further evaluation either here or in the emergency department. At the time of discharge, she verbalized understanding and consent with the discharge plan as it was reviewed with her. All questions were fielded  by provider and/or clinic staff prior to patient discharge.    Final Clinical Impressions / Urgent Care Diagnoses:   Final diagnoses:  Urethritis    New Prescriptions:  [place] Controlled Substance Registry consulted? Not Applicable  Meds ordered this encounter  Medications  . sulfamethoxazole-trimethoprim (BACTRIM DS) 800-160 MG tablet    Sig: Take 1 tablet by mouth 2 (two) times daily for 3 days.    Dispense:  6 tablet    Refill:  0  . phenazopyridine (PYRIDIUM) 200 MG tablet    Sig: Take 1 tablet (200 mg total) by mouth 3 (three) times daily as needed for pain.    Dispense:  9 tablet    Refill:  0    Recommended Follow up Care:  Patient encouraged to follow up with the following provider within the specified time frame, or sooner as dictated by the severity of her symptoms. As always, she was instructed  that for any urgent/emergent care needs, she should seek care either here or in the emergency department for more immediate evaluation.  Follow-up Information    Marguarite ArbourSparks, Jeffrey D, MD In 1 week.   Specialty: Internal Medicine Why: General reassessment of symptoms if not improving Contact information: [address] [facility] [place] [address] [phone]         NOTE: This note was prepared using Dragon dictation software along with smaller phrase technology. Despite my best ability to proofread, there is the potential that transcriptional errors may still occur from this process, and are completely unintentional.    Verlee MonteGray, Keyonna Comunale E, NP 07/10/19 [phone]

## 2019-07-10 NOTE — ED Triage Notes (Signed)
Patient c/o lower abdominal pain that started 2 weeks ago.  Patient also reports Headache.  Patient denies fevers.

## 2019-07-10 NOTE — Discharge Instructions (Signed)
It was very nice seeing you today in clinic. Thank you for entrusting me with your care.   Your urine looks ok. I am going to culture it anyway. If not better in next 2-3 day, may start antibiotic. Please utilize the medications that we discussed. Your prescriptions have been called in to your pharmacy. Increase water intake.   Make arrangements to follow up with your regular doctor in 1 week for re-evaluation if not improving. If your symptoms/condition worsens, please seek follow up care either here or in the ER. Please remember, our [place] providers are "right here with you" when you need [place].   Again, it was my pleasure to take care of you today. Thank you for choosing our clinic. I hope that you start to feel better quickly.   Honor Loh, MSN, APRN, FNP-C, CEN Advanced Practice Provider [facility]

## 2019-07-11 LAB — URINE CULTURE: Culture: <10000 — AB

## 2019-07-21 ENCOUNTER — Ambulatory Visit
Admission: RE | Admit: 2019-07-21 | Discharge: 2019-07-21 | Disposition: A | Discharge status: Home / Self Care | Payer: Medicaid Other | Source: Ambulatory Visit | Attending: Obstetrics & Gynecology | Admitting: Obstetrics & Gynecology

## 2019-07-21 DIAGNOSIS — Z1231 Encounter for screening mammogram for malignant neoplasm of breast: Secondary | ICD-10-CM | POA: Insufficient documentation

## 2020-05-15 ENCOUNTER — Encounter (HOSPITAL_COMMUNITY): Payer: Self-pay

## 2020-05-15 ENCOUNTER — Ambulatory Visit (INDEPENDENT_AMBULATORY_CARE_PROVIDER_SITE_OTHER)
Admission: RE | Admit: 2020-05-15 | Discharge: 2020-05-15 | Disposition: A | Discharge status: Other Acute Inpatient Hospital | Payer: Medicaid Other | Source: Ambulatory Visit

## 2020-05-15 ENCOUNTER — Ambulatory Visit (HOSPITAL_COMMUNITY)
Admission: EM | Admit: 2020-05-15 | Discharge: 2020-05-15 | Disposition: A | Discharge status: Home / Self Care | Payer: Medicaid Other | Attending: Family Medicine | Admitting: Family Medicine

## 2020-05-15 ENCOUNTER — Other Ambulatory Visit: Payer: Self-pay

## 2020-05-15 DIAGNOSIS — H9202 Otalgia, left ear: Secondary | ICD-10-CM | POA: Diagnosis not present

## 2020-05-15 DIAGNOSIS — H60392 Other infective otitis externa, left ear: Secondary | ICD-10-CM

## 2020-05-15 DIAGNOSIS — H66002 Acute suppurative otitis media without spontaneous rupture of ear drum, left ear: Secondary | ICD-10-CM

## 2020-05-15 MED ORDER — AMOXICILLIN 875 MG PO TABS
875.0000 mg | ORAL_TABLET | Freq: Two times a day (BID) | ORAL | 0 refills | Status: AC
Start: 2020-05-15 — End: 2020-05-25

## 2020-05-15 MED ORDER — NEOMYCIN-POLYMYXIN-HC 3.5-10000-1 OT SUSP: 3.0000 [drp] | Freq: Three times a day (TID) | OTIC | 0 refills | Status: DC

## 2020-05-15 NOTE — ED Provider Notes (Signed)
Virtual Visit via Video Note:  Kathy Webb  initiated request for Telemedicine visit with [facility] team. I connected with Kathy Webb  on 05/15/2020 at 11:31 AM  for a synchronized telemedicine visit using a video enabled HIPPA compliant telemedicine application. I verified that I am speaking with Kathy Webb  using two identifiers. [name], [name]  was physically located in a [facility] Urgent care site and SIBBIE FLAMMIA was located at a different location.   The limitations of evaluation and management by telemedicine as well as the availability of in-person appointments were discussed. Patient was informed that she  may incur a bill ( including co-pay) for this virtual visit encounter. [name]  expressed understanding and gave verbal consent to proceed with virtual visit.     History of Present Illness:Kathy Webb  is a 44 y.o. female presents with 4 day hx of acute onset left ear pain with drainage. Feels swelling of her left ear canal and has difficulty placing her hearing aid. Has been having trouble with allergies and typically can have trouble with this around this time of year. Has tried tea tree oil with minimal relief. Has tried hot compresses as well. Used ibuprofen. Also used left over hydrocodone that she tried.      ROS  No current facility-administered medications for this encounter.   Current Outpatient Medications  Medication Sig Dispense Refill  . escitalopram (LEXAPRO) 10 MG tablet Take 10 mg by mouth at bedtime.    Marland Kitchen levonorgestrel (MIRENA) 20 MCG/24HR IUD 1 each by Intrauterine route once.    . nortriptyline (PAMELOR) 10 MG capsule Take 20 mg by mouth at bedtime as needed.    . phenazopyridine (PYRIDIUM) 200 MG tablet Take 1 tablet (200 mg total) by mouth 3 (three) times daily as needed for pain. 9 tablet 0     Allergies  Allergen Reactions  . Claritin [Loratadine] Diarrhea     Past Medical History:  Diagnosis Date  . Hemorrhoids      Past Surgical History:  Procedure Laterality Date  . TONSILLECTOMY        Observations/Objective: Physical Exam Unable to access patient's video. She was not able to log on. Phone visit only.   Assessment and Plan:  PDMP not reviewed this encounter.  1. Left ear pain    Recommended in person evaluation. Suspect OE but will need to examine to see if she needs an ear wick versus tx for OM. Patient will come [facility] clinic.    Follow Up Instructions:    I discussed the assessment and treatment plan with the patient. The patient was provided an opportunity to ask questions and all were answered. The patient agreed with the plan and demonstrated an understanding of the instructions.   The patient was advised to call back or seek an in-person evaluation if the symptoms worsen or if the condition fails to improve as anticipated.  I provided 10 minutes of non-face-to-face time during this encounter.    [name], [name]  05/15/2020 11:31 AM         [name], [name] 05/15/20 1138

## 2020-05-15 NOTE — ED Provider Notes (Signed)
[facility]   [id number] 05/15/20 Arrival Time: 1238  ASSESSMENT & PLAN:  1. Non-recurrent acute suppurative otitis media of left ear without spontaneous rupture of tympanic membrane   2. Infective otitis externa of left ear     Begin: Meds ordered this encounter  Medications  . amoxicillin (AMOXIL) 875 MG tablet    Sig: Take 1 tablet (875 mg total) by mouth 2 (two) times daily for 10 days.    Dispense:  20 tablet    Refill:  0  . neomycin-polymyxin-hydrocortisone (CORTISPORIN) 3.5-10000-1 OTIC suspension    Sig: Place 3 drops into the left ear 3 (three) times daily.    Dispense:  10 mL    Refill:  0    No sign of TM rupture. May f/u with PCP or here as needed if not improving as anticipated.  Reviewed expectations re: course of current medical issues. Questions answered. Outlined signs and symptoms indicating need for more acute intervention. Patient verbalized understanding. After Visit Summary given.   SUBJECTIVE: History from: patient.  Kathy Webb is a 44 y.o. female who presents with complaint of left otalgia; with slight and occasional off-white drainage; without bleeding. Onset gradual, over the past week. Recent cold symptoms: congestion/allergies. Fever: no. Overall normal PO intake without n/v. Sick contacts: no. OTC treatment: tea tree oil without help.  Social History   Tobacco Use  Smoking Status Never Smoker  Smokeless Tobacco Never Used      OBJECTIVE:  Vitals:   05/15/20 1407  BP: (!) 108/93  Pulse: 84  Resp: 18  Temp: 98.6 F (37 C)  TempSrc: Oral  SpO2: 98%     General appearance: alert; NAD Ear Canal: edema and inflammation on the left; canal is patent TM: left: erythematous, dull, bulging; no bleeding Neck: supple without LAD Lungs: unlabored respirations, speaks full sentences without difficulty Skin: warm and dry Psychological: alert and cooperative; normal mood and affect  Allergies  Allergen Reactions  .  Claritin [Loratadine] Diarrhea    Past Medical History:  Diagnosis Date  . Hemorrhoids    Family History  Problem Relation Age of Onset  . Breast cancer Paternal Grandmother        91's had mastectomy. possibly for precancerous  . Healthy Mother   . Healthy Father    Social History   Socioeconomic History  . Marital status: Married    Spouse name: Not on file  . Number of children: Not on file  . Years of education: Not on file  . Highest education level: Not on file  Occupational History  . Not on file  Tobacco Use  . Smoking status: Never Smoker  . Smokeless tobacco: Never Used  Substance and Sexual Activity  . Alcohol use: Yes    Comment: OCcassionally  . Drug use: No  . Sexual activity: Yes    Birth control/protection: I.U.D.  Other Topics Concern  . Not on file  Social History Narrative  . Not on file   Social Determinants of Health   Financial Resource Strain:   . Difficulty of Paying Living Expenses:   Food Insecurity:   . Worried About Programme researcher, broadcasting/film/video in the Last Year:   . Barista in the Last Year:   Transportation Needs:   . Freight forwarder (Medical):   Marland Kitchen Lack of Transportation (Non-Medical):   Physical Activity:   . Days of Exercise per Week:   . Minutes of Exercise per Session:   Stress:   .  Feeling of Stress :   Social Connections:   . Frequency of Communication with Friends and Family:   . Frequency of Social Gatherings with Friends and Family:   . Attends Religious Services:   . Active Member of Clubs or Organizations:   . Attends Archivist Meetings:   Marland Kitchen Marital Status:   Intimate Partner Violence:   . Fear of Current or Ex-Partner:   . Emotionally Abused:   Marland Kitchen Physically Abused:   . Sexually Abused:             Vanessa Kick, MD 05/15/20 [phone]

## 2020-05-15 NOTE — ED Triage Notes (Signed)
Pt is here with a left pain, drainage, hearing loss for a week now. Pt has used warm compresses, tea tree oil to relieve discomfort.

## 2020-11-23 ENCOUNTER — Other Ambulatory Visit: Payer: Self-pay

## 2020-11-23 ENCOUNTER — Ambulatory Visit
Admission: EM | Admit: 2020-11-23 | Discharge: 2020-11-23 | Disposition: A | Discharge status: Home / Self Care | Payer: Medicaid Other | Attending: Physician Assistant | Admitting: Physician Assistant

## 2020-11-23 ENCOUNTER — Encounter: Payer: Self-pay | Admitting: Emergency Medicine

## 2020-11-23 DIAGNOSIS — L600 Ingrowing nail: Secondary | ICD-10-CM | POA: Diagnosis not present

## 2020-11-23 DIAGNOSIS — L03031 Cellulitis of right toe: Secondary | ICD-10-CM

## 2020-11-23 MED ORDER — SULFAMETHOXAZOLE-TRIMETHOPRIM 800-160 MG PO TABS: 1.0000 | ORAL_TABLET | Freq: Two times a day (BID) | ORAL | 0 refills | Status: AC

## 2020-11-23 MED ORDER — TRIAMCINOLONE ACETONIDE 0.1 % EX CREA: 1.0000 "application " | TOPICAL_CREAM | Freq: Two times a day (BID) | CUTANEOUS | 0 refills | Status: AC

## 2020-11-23 NOTE — ED Provider Notes (Signed)
[facility]    CSN: [id number] Arrival date & time: 11/23/20  0846      History   Chief Complaint Chief Complaint  Patient presents with  . Ingrown Toenail    HPI Kathy Webb is a 44 y.o. female presenting for right sided ingrown great toenail x 1 week. She says she has been doing warm soaks at home and using rubbing alcohol w/o improvement in symptoms. She says that she was able to pull part of the toenail out and has had some pustular drainage and increased redness and swelling of the lateral toe since. Denies fever. Admits to pain. No other concerns today.  HPI  Past Medical History:  Diagnosis Date  . Hemorrhoids     There are no problems to display for this patient.   Past Surgical History:  Procedure Laterality Date  . TONSILLECTOMY      OB History   No obstetric history on file.      Home Medications    Prior to Admission medications   Medication Sig Start Date End Date Taking? Authorizing Provider  escitalopram (LEXAPRO) 10 MG tablet Take 10 mg by mouth at bedtime.   Yes [provider]  levonorgestrel (MIRENA) 20 MCG/24HR IUD 1 each by Intrauterine route once.   Yes [provider]  nortriptyline (PAMELOR) 10 MG capsule Take 20 mg by mouth at bedtime as needed. 06/03/17  Yes [provider]  neomycin-polymyxin-hydrocortisone (CORTISPORIN) 3.5-10000-1 OTIC suspension Place 3 drops into the left ear 3 (three) times daily. 05/15/20   Mardella Layman, MD  phenazopyridine (PYRIDIUM) 200 MG tablet Take 1 tablet (200 mg total) by mouth 3 (three) times daily as needed for pain. 07/10/19   Verlee Monte, NP  sulfamethoxazole-trimethoprim (BACTRIM DS) 800-160 MG tablet Take 1 tablet by mouth 2 (two) times daily for 7 days. 11/23/20 11/30/20  Eusebio Friendly B, [name]  triamcinolone (KENALOG) 0.1 % Apply 1 application topically 2 (two) times daily for 7 days. 11/23/20 11/30/20  Shirlee Latch, [name]    Family History Family History    Problem Relation Age of Onset  . Breast cancer Paternal Grandmother        68's had mastectomy. possibly for precancerous  . Healthy Mother   . Healthy Father     Social History Social History   Tobacco Use  . Smoking status: Never Smoker  . Smokeless tobacco: Never Used  Vaping Use  . Vaping Use: Never used  Substance Use Topics  . Alcohol use: Yes    Comment: OCcassionally  . Drug use: No     Allergies   Claritin [loratadine]   Review of Systems Review of Systems  Constitutional: Negative for fatigue and fever.  Musculoskeletal: Negative for arthralgias and gait problem.  Skin: Positive for color change. Negative for rash and wound.  Neurological: Negative for weakness and numbness.     Physical Exam Triage Vital Signs ED Triage Vitals [11/23/20 0858]  Enc Vitals Group     BP (!) 136/103     Pulse Rate 92     Resp      Temp 98.4 F (36.9 C)     Temp Source Oral     SpO2 100 %     Weight      Height      Head Circumference      Peak Flow      Pain Score 10     Pain Loc      Pain Edu?  Excl. in GC?    No data found.  Updated Vital Signs BP (!) 136/103 (BP Location: Left Arm)   Pulse 92   Temp 98.4 F (36.9 C) (Oral)   SpO2 100%        Physical Exam Vitals and nursing note reviewed.  Constitutional:      General: She is not in acute distress.    Appearance: Normal appearance. She is not ill-appearing or toxic-appearing.  HENT:     Head: Normocephalic and atraumatic.  Eyes:     General: No scleral icterus.       Right eye: No discharge.        Left eye: No discharge.     Conjunctiva/sclera: Conjunctivae normal.  Cardiovascular:     Rate and Rhythm: Normal rate and regular rhythm.  Pulmonary:     Effort: Pulmonary effort is normal. No respiratory distress.  Musculoskeletal:     Cervical back: Neck supple.  Skin:    General: Skin is dry.     Comments: RIGHT GREAT TOE: There is erythema and swelling along with TTP of the lateral  toe. Toenail is mildly ingrown; it looks like she has removed a portion already. Scant pustular drainage noted. Full ROM of toe and no bony tenderness  Neurological:     General: No focal deficit present.     Mental Status: She is alert. Mental status is at baseline.     Motor: No weakness.     Gait: Gait normal.  Psychiatric:        Mood and Affect: Mood normal.        Behavior: Behavior normal.        Thought Content: Thought content normal.      UC Treatments / Results  Labs (all labs ordered are listed, but only abnormal results are displayed) Labs Reviewed - No data to display  EKG   Radiology No results found.  Procedures Procedures (including critical care time)  Medications Ordered in UC Medications - No data to display  Initial Impression / Assessment and Plan / UC Course  I have reviewed the triage vital signs and the nursing notes.  Pertinent labs & imaging results that were available during my care of the patient were reviewed by me and considered in my medical decision making (see chart for details).   Mild ingrown toenail with a portion already removed. Treating secondary bacterial infection with Bactrim DS. Discussed continuing warm water soaks and then applying topical triamcinolone to help reduce swelling. I believe this can be treated without removing anymore of the toenail. Advised to follow up with PCP for re-check next week or can return to our office for any new/worsening symptoms or treatment failure.  Final Clinical Impressions(s) / UC Diagnoses   Final diagnoses:  Ingrown right big toenail  Cellulitis of right toe     Discharge Instructions     Begin antibiotics. Soak the  foot in warm, soapy water for 10 -20 min 3 times per day for 1-2 weeks, pushing the lateral nail fold away from the nail plate. Alternatively, a solution of water mixed with 1 to 2 teaspoons of Epsom salts can be used. A topical corticosteroid can be applied after soaking to  reduce inflammation--Consider using OTC hydrocortisone if a corticosteroid has not been rx for you today. F/u with PCP in 3 days for re-examination or sooner if signs of infection     ED Prescriptions    Medication Sig Dispense Auth. Provider   triamcinolone (  KENALOG) 0.1 % Apply 1 application topically 2 (two) times daily for 7 days. 30 g Eusebio Friendly B, [name]   sulfamethoxazole-trimethoprim (BACTRIM DS) 800-160 MG tablet Take 1 tablet by mouth 2 (two) times daily for 7 days. 14 tablet Gareth Morgan     PDMP not reviewed this encounter.   Shirlee Latch, [name] 11/23/20 [phone]

## 2020-11-23 NOTE — Discharge Instructions (Addendum)
Begin antibiotics. Soak the  foot in warm, soapy water for 10 -20 min 3 times per day for 1-2 weeks, pushing the lateral nail fold away from the nail plate. Alternatively, a solution of water mixed with 1 to 2 teaspoons of Epsom salts can be used. A topical corticosteroid can be applied after soaking to reduce inflammation--Consider using OTC hydrocortisone if a corticosteroid has not been rx for you today. F/u with PCP in 3 days for re-examination or sooner if signs of infection

## 2020-11-23 NOTE — ED Triage Notes (Signed)
Pt c/o right ingrown toenail on the great toe x 1 week. Pt states she has been soaking it and using ruibbing alcohol and ointment with no relief.

## 2021-02-09 ENCOUNTER — Other Ambulatory Visit: Payer: Self-pay

## 2021-02-09 ENCOUNTER — Ambulatory Visit
Admission: RE | Admit: 2021-02-09 | Discharge: 2021-02-09 | Disposition: A | Discharge status: Home / Self Care | Payer: Medicaid Other | Source: Ambulatory Visit | Attending: Emergency Medicine | Admitting: Emergency Medicine

## 2021-02-09 VITALS — BP 123/93 | HR 81 | Temp 98.5°F | Resp 14 | Ht 69.0 in | Wt 225.0 lb

## 2021-02-09 DIAGNOSIS — R350 Frequency of micturition: Secondary | ICD-10-CM | POA: Diagnosis not present

## 2021-02-09 DIAGNOSIS — R300 Dysuria: Secondary | ICD-10-CM | POA: Diagnosis not present

## 2021-02-09 LAB — URINALYSIS, COMPLETE (UACMP) WITH MICROSCOPIC
Bilirubin Urine: NEGATIVE
Glucose, UA: NEGATIVE mg/dL
Ketones, ur: NEGATIVE mg/dL
Nitrite: NEGATIVE
Protein, ur: NEGATIVE mg/dL
Specific Gravity, Urine: 1.025 (ref 1.005–1.030)
pH: 6 (ref 5.0–8.0)

## 2021-02-09 MED ORDER — PHENAZOPYRIDINE HCL 200 MG PO TABS: 200.0000 mg | ORAL_TABLET | Freq: Three times a day (TID) | ORAL | 0 refills | Status: DC

## 2021-02-09 MED ORDER — FLUCONAZOLE 200 MG PO TABS: 200.0000 mg | ORAL_TABLET | Freq: Every day | ORAL | 1 refills | Status: AC

## 2021-02-09 NOTE — ED Triage Notes (Signed)
Patient c/o urinary frequency and burning when urinating that started couple of days ago.

## 2021-02-09 NOTE — ED Provider Notes (Signed)
[facility]    CSN: [id number] Arrival date & time: 02/09/21  0948      History   Chief Complaint Chief Complaint  Patient presents with  . Urinary Frequency  . Dysuria    HPI Kathy Webb is a 45 y.o. female.   HPI   45 year old female here for evaluation of urinary frequency and burning with urination.  Patient reports that she has been having symptoms for the last 3 to 4 days.  Associated with the burning and frequency she is also had some diarrhea, bloating, and nausea.  Patient denies fever, vomiting, back pain, or blood in her urine.  Past Medical History:  Diagnosis Date  . Hemorrhoids     There are no problems to display for this patient.   Past Surgical History:  Procedure Laterality Date  . TONSILLECTOMY      OB History   No obstetric history on file.      Home Medications    Prior to Admission medications   Medication Sig Start Date End Date Taking? Authorizing Provider  escitalopram (LEXAPRO) 10 MG tablet Take 10 mg by mouth at bedtime.   Yes [provider]  fluconazole (DIFLUCAN) 200 MG tablet Take 1 tablet (200 mg total) by mouth daily for 2 doses. Take 1 tablet now and repeat in 7 days if your symptoms continue. 02/09/21 02/11/21 Yes Becky Augusta, NP  levonorgestrel (MIRENA) 20 MCG/24HR IUD 1 each by Intrauterine route once.   Yes [provider]  nortriptyline (PAMELOR) 10 MG capsule Take 20 mg by mouth at bedtime as needed. 06/03/17  Yes [provider]  phenazopyridine (PYRIDIUM) 200 MG tablet Take 1 tablet (200 mg total) by mouth 3 (three) times daily. 02/09/21  Yes Becky Augusta, NP  neomycin-polymyxin-hydrocortisone (CORTISPORIN) 3.5-10000-1 OTIC suspension Place 3 drops into the left ear 3 (three) times daily. 05/15/20   Mardella Layman, MD    Family History Family History  Problem Relation Age of Onset  . Breast cancer Paternal Grandmother        53's had mastectomy. possibly for precancerous  .  Healthy Mother   . Healthy Father     Social History Social History   Tobacco Use  . Smoking status: Never Smoker  . Smokeless tobacco: Never Used  Vaping Use  . Vaping Use: Never used  Substance Use Topics  . Alcohol use: Yes    Comment: OCcassionally  . Drug use: No     Allergies   Claritin [loratadine]   Review of Systems Review of Systems  Constitutional: Negative for activity change and fever.  Gastrointestinal: Positive for diarrhea and nausea. Negative for vomiting.  Genitourinary: Positive for dysuria and frequency. Negative for hematuria.  Musculoskeletal: Negative for back pain.  Skin: Negative for rash.  Hematological: Negative.   Psychiatric/Behavioral: Negative.      Physical Exam Triage Vital Signs ED Triage Vitals  Enc Vitals Group     BP 02/09/21 1006 (!) 123/93     Pulse Rate 02/09/21 1006 81     Resp 02/09/21 1006 14     Temp 02/09/21 1006 98.5 F (36.9 C)     Temp Source 02/09/21 1006 Oral     SpO2 02/09/21 1006 100 %     Weight 02/09/21 1003 225 lb (102.1 kg)     Height 02/09/21 1003 5\' 9"  (1.753 m)     Head Circumference --      Peak Flow --      Pain Score  02/09/21 1003 3     Pain Loc --      Pain Edu? --      Excl. in GC? --    No data found.  Updated Vital Signs BP (!) 123/93 (BP Location: Right Arm)   Pulse 81   Temp 98.5 F (36.9 C) (Oral)   Resp 14   Ht 5\' 9"  (1.753 m)   Wt 225 lb (102.1 kg)   SpO2 100%   BMI 33.23 kg/m   Visual Acuity Right Eye Distance:   Left Eye Distance:   Bilateral Distance:    Right Eye Near:   Left Eye Near:    Bilateral Near:     Physical Exam Vitals and nursing note reviewed.  Constitutional:      General: She is not in acute distress.    Appearance: Normal appearance. She is not ill-appearing.  HENT:     Head: Normocephalic and atraumatic.     Right Ear: Tympanic membrane, ear canal and external ear normal.     Left Ear: Tympanic membrane, ear canal and external ear normal.   Cardiovascular:     Rate and Rhythm: Normal rate and regular rhythm.     Pulses: Normal pulses.     Heart sounds: Normal heart sounds. No murmur heard. No gallop.   Pulmonary:     Effort: Pulmonary effort is normal.     Breath sounds: Normal breath sounds. No wheezing, rhonchi or rales.  Abdominal:     General: Bowel sounds are normal.     Palpations: Abdomen is soft.     Tenderness: There is no abdominal tenderness. There is no right CVA tenderness, left CVA tenderness, guarding or rebound.  Skin:    General: Skin is warm and dry.     Capillary Refill: Capillary refill takes less than 2 seconds.     Findings: No erythema or rash.  Neurological:     General: No focal deficit present.     Mental Status: She is alert and oriented to person, place, and time.  Psychiatric:        Mood and Affect: Mood normal.        Behavior: Behavior normal.        Thought Content: Thought content normal.        Judgment: Judgment normal.      UC Treatments / Results  Labs (all labs ordered are listed, but only abnormal results are displayed) Labs Reviewed  URINALYSIS, COMPLETE (UACMP) WITH MICROSCOPIC - Abnormal; Notable for the following components:      Result Value   APPearance HAZY (*)    Hgb urine dipstick SMALL (*)    Leukocytes,Ua TRACE (*)    Bacteria, UA FEW (*)    All other components within normal limits  URINE CULTURE    EKG   Radiology No results found.  Procedures Procedures (including critical care time)  Medications Ordered in UC Medications - No data to display  Initial Impression / Assessment and Plan / UC Course  I have reviewed the triage vital signs and the nursing notes.  Pertinent labs & imaging results that were available during my care of the patient were reviewed by me and considered in my medical decision making (see chart for details).   Patient is a very pleasant 45 year old female here for evaluation of UTI symptoms have been going on for last 3  to 4 days.  Patient's physical exam reveals clear lung sounds in all fields, heart sounds S1-S2 and  crisp, belly is soft, nontender, nondistended, with positive bowel sounds in all 4 quadrants.  No CVA tenderness on exam.  Will send UA.  UA is positive for small muscle hemoglobin, trace leukocytes, no nitrites, 11-20 RBCs, few bacteria, and budding yeast.  Will send urine for culture and treat patient for yeast infection.   Final Clinical Impressions(s) / UC Diagnoses   Final diagnoses:  Dysuria     Discharge Instructions     Continue to increase your oral fluid intake so that you increase your urine production and flush your urinary system.  Take the Diflucan for treatment of the yeast infection causing your urinary symptoms.  We will also send her urine for culture and if any bacteria grows out we will treat you at that time.  Follow-up with your primary care provider for any new or worsening symptoms.    ED Prescriptions    Medication Sig Dispense Auth. Provider   fluconazole (DIFLUCAN) 200 MG tablet Take 1 tablet (200 mg total) by mouth daily for 2 doses. Take 1 tablet now and repeat in 7 days if your symptoms continue. 2 tablet Becky Augusta, NP   phenazopyridine (PYRIDIUM) 200 MG tablet Take 1 tablet (200 mg total) by mouth 3 (three) times daily. 6 tablet Becky Augusta, NP     PDMP not reviewed this encounter.   Becky Augusta, NP 02/09/21 1104

## 2021-02-09 NOTE — Discharge Instructions (Addendum)
Continue to increase your oral fluid intake so that you increase your urine production and flush your urinary system.  Take the Diflucan for treatment of the yeast infection causing your urinary symptoms.  We will also send her urine for culture and if any bacteria grows out we will treat you at that time.  Follow-up with your primary care provider for any new or worsening symptoms.

## 2021-02-10 LAB — URINE CULTURE: Special Requests: NORMAL

## 2021-09-10 ENCOUNTER — Other Ambulatory Visit: Payer: Self-pay | Admitting: Obstetrics and Gynecology

## 2021-09-10 DIAGNOSIS — Z1231 Encounter for screening mammogram for malignant neoplasm of breast: Secondary | ICD-10-CM

## 2021-09-30 ENCOUNTER — Ambulatory Visit
Admission: RE | Admit: 2021-09-30 | Discharge: 2021-09-30 | Disposition: A | Discharge status: Home / Self Care | Payer: Medicaid Other | Source: Ambulatory Visit | Attending: Obstetrics and Gynecology | Admitting: Obstetrics and Gynecology

## 2021-09-30 ENCOUNTER — Other Ambulatory Visit: Payer: Self-pay

## 2021-09-30 DIAGNOSIS — Z1231 Encounter for screening mammogram for malignant neoplasm of breast: Secondary | ICD-10-CM | POA: Insufficient documentation

## 2021-10-02 ENCOUNTER — Telehealth: Payer: Medicaid Other | Admitting: Family

## 2021-10-02 DIAGNOSIS — R399 Unspecified symptoms and signs involving the genitourinary system: Secondary | ICD-10-CM

## 2021-10-02 MED ORDER — CEPHALEXIN 500 MG PO CAPS: 500.0000 mg | ORAL_CAPSULE | Freq: Two times a day (BID) | ORAL | 0 refills | Status: DC

## 2021-10-02 NOTE — Progress Notes (Signed)
Virtual Visit Consent   Kathy Webb, you are scheduled for a virtual visit with a Kathy Webb Kathy Webb today.     Just as with appointments in the office, your consent must be obtained to participate.  Your consent will be active for this visit and any virtual visit you may have with one of our providers in the next 365 days.     If you have a MyChart account, a copy of this consent can be sent to you electronically.  All virtual visits are billed to your insurance company just like a traditional visit in the office.    As this is a virtual visit, video technology does not allow for your Kathy Webb to perform a traditional examination.  This may limit your Kathy Webb's ability to fully assess your condition.  If your Kathy Webb identifies any concerns that need to be evaluated in person or the need to arrange testing (such as labs, EKG, etc.), we will make arrangements to do so.     Although advances in technology are sophisticated, we cannot ensure that it will always work on either your end or our end.  If the connection with a video visit is poor, the visit may have to be switched to a telephone visit.  With either a video or telephone visit, we are not always able to ensure that we have a secure connection.     I need to obtain your verbal consent now.   Are you willing to proceed with your visit today?    Kathy Webb has provided verbal consent on 10/02/2021 for a virtual visit (video or telephone).   Kathy Rodney, FNP   Date: 10/02/2021 9:37 AM   Virtual Visit via Video Note   I, Kathy Webb, connected with  Kathy Webb  ([id number], 1976/05/09) on 10/02/21 at  9:30 AM EDT by a video-enabled telemedicine application and verified that I am speaking with the correct person using two identifiers.  Location: Patient: Virtual Visit Location Patient: Kathy Webb Kathy Webb: Virtual Visit Location Kathy Webb: Kathy Webb   I discussed the limitations of evaluation and management by telemedicine and the  availability of in person appointments. The patient expressed understanding and agreed to proceed.    History of Present Illness: Kathy Webb is a 45 y.o. who identifies as a female who was assigned female at birth, and is being seen today for dysuria.  HPI: Dysuria  This is a new problem. The current episode started 1 to 4 weeks ago. The pain is at a severity of 7/10. Associated symptoms include frequency, hesitancy, nausea and urgency. Pertinent negatives include no hematuria. She has tried increased fluids for the symptoms. The treatment provided mild relief.   Problems: There are no problems to display for this patient.   Allergies:  Allergies  Allergen Reactions   Claritin [Loratadine] Diarrhea   Medications:  Current Outpatient Medications:    cephALEXin (KEFLEX) 500 MG capsule, Take 1 capsule (500 mg total) by mouth 2 (two) times daily., Disp: 14 capsule, Rfl: 0   escitalopram (LEXAPRO) 10 MG tablet, Take 10 mg by mouth at bedtime., Disp: , Rfl:    levonorgestrel (MIRENA) 20 MCG/24HR IUD, 1 each by Intrauterine route once., Disp: , Rfl:    neomycin-polymyxin-hydrocortisone (CORTISPORIN) 3.5-10000-1 OTIC suspension, Place 3 drops into the left ear 3 (three) times daily., Disp: 10 mL, Rfl: 0   nortriptyline (PAMELOR) 10 MG capsule, Take 20 mg by mouth at bedtime as needed., Disp: , Rfl:    phenazopyridine (  PYRIDIUM) 200 MG tablet, Take 1 tablet (200 mg total) by mouth 3 (three) times daily., Disp: 6 tablet, Rfl: 0  Observations/Objective: Patient is well-developed, well-nourished in no acute distress.  Resting comfortably  at Kathy Webb.  Head is normocephalic, atraumatic.  No labored breathing.  Speech is clear and coherent with logical content.  Patient is alert and oriented at baseline.    Assessment and Plan: 1. UTI symptoms - cephALEXin (KEFLEX) 500 MG capsule; Take 1 capsule (500 mg total) by mouth 2 (two) times daily.  Dispense: 14 capsule; Refill: 0 Force fluids AZO over  the counter X2 days RTO if symptoms worsen or do not improve     Follow Up Instructions: I discussed the assessment and treatment plan with the patient. The patient was provided an opportunity to ask questions and all were answered. The patient agreed with the plan and demonstrated an understanding of the instructions.  A copy of instructions were sent to the patient via MyChart unless otherwise noted below.     The patient was advised to call back or seek an in-person evaluation if the symptoms worsen or if the condition fails to improve as anticipated.  Time:  I spent 13 minutes with the patient via telehealth technology discussing the above problems/concerns.    Kathy Rodney, FNP

## 2022-01-21 ENCOUNTER — Other Ambulatory Visit: Payer: Self-pay

## 2022-01-21 ENCOUNTER — Ambulatory Visit
Admission: RE | Admit: 2022-01-21 | Discharge: 2022-01-21 | Disposition: A | Discharge status: Home / Self Care | Payer: Medicaid Other | Source: Ambulatory Visit | Attending: Emergency Medicine | Admitting: Emergency Medicine

## 2022-01-21 VITALS — BP 116/82 | HR 83 | Temp 98.5°F | Resp 18 | Ht 69.0 in | Wt 230.0 lb

## 2022-01-21 DIAGNOSIS — R051 Acute cough: Secondary | ICD-10-CM

## 2022-01-21 DIAGNOSIS — J069 Acute upper respiratory infection, unspecified: Secondary | ICD-10-CM | POA: Diagnosis not present

## 2022-01-21 MED ORDER — ALBUTEROL SULFATE HFA 108 (90 BASE) MCG/ACT IN AERS: 2.0000 | INHALATION_SPRAY | RESPIRATORY_TRACT | 0 refills | Status: AC | PRN

## 2022-01-21 MED ORDER — AMOXICILLIN-POT CLAVULANATE 875-125 MG PO TABS: 1.0000 | ORAL_TABLET | Freq: Two times a day (BID) | ORAL | 0 refills | Status: AC

## 2022-01-21 MED ORDER — PROMETHAZINE-DM 6.25-15 MG/5ML PO SYRP: 5.0000 mL | ORAL_SOLUTION | Freq: Four times a day (QID) | ORAL | 0 refills | Status: DC | PRN

## 2022-01-21 MED ORDER — BENZONATATE 100 MG PO CAPS: 200.0000 mg | ORAL_CAPSULE | Freq: Three times a day (TID) | ORAL | 0 refills | Status: DC

## 2022-01-21 NOTE — Discharge Instructions (Signed)
Take the Augmentin twice daily for 10 days to treat your URI.   Use the Atrovent nasal spray, 2 squirts in each nostril every 6 hours, as needed for runny nose and postnasal drip.  Use the Tessalon Perles every 8 hours during the day.  Take them with a small sip of water.  They may give you some numbness to the base of your tongue or a metallic taste in your mouth, this is normal.  Use the Promethazine DM cough syrup at bedtime for cough and congestion.  It will make you drowsy so do not take it during the day.  Use the Albuterol inhaler, 2 puffs every 4-6 hours as needed for shortness of breath.  Return for reevaluation or see your primary care provider for any new or worsening symptoms.

## 2022-01-21 NOTE — ED Provider Notes (Signed)
[facility]    CSN: [id number] Arrival date & time: 01/21/22  0847      History   Chief Complaint Chief Complaint  Patient presents with   Cough   Nasal Congestion   Headache    HPI Kathy Webb is a 46 y.o. female.   HPI  46 year old female here for evaluation of respiratory complaints.  Patient reports that for the last week she has been experiencing a headache, ear pressure, sinus pressure with yellow nasal discharge, nonproductive cough, and some shortness of breath.  She denies any fever or wheezing.  She has been using an old albuterol Hailer to help with her shortness of breath symptoms and that has been working but she needs a refill.  She denies any sick contacts.  Past Medical History:  Diagnosis Date   Hemorrhoids     There are no problems to display for this patient.   Past Surgical History:  Procedure Laterality Date   TONSILLECTOMY      OB History   No obstetric history on file.      Home Medications    Prior to Admission medications   Medication Sig Start Date End Date Taking? Authorizing Provider  albuterol (VENTOLIN HFA) 108 (90 Base) MCG/ACT inhaler Inhale 2 puffs into the lungs every 4 (four) hours as needed. 01/21/22  Yes Becky Augustayan, Henry Utsey, NP  amoxicillin-clavulanate (AUGMENTIN) 875-125 MG tablet Take 1 tablet by mouth every 12 (twelve) hours for 10 days. 01/21/22 01/31/22 Yes Becky Augustayan, Nyair Depaulo, NP  benzonatate (TESSALON) 100 MG capsule Take 2 capsules (200 mg total) by mouth every 8 (eight) hours. 01/21/22  Yes Becky Augustayan, Marisa Hufstetler, NP  escitalopram (LEXAPRO) 10 MG tablet Take 10 mg by mouth at bedtime.   Yes [provider]  levonorgestrel (MIRENA) 20 MCG/24HR IUD 1 each by Intrauterine route once.   Yes [provider]  nortriptyline (PAMELOR) 10 MG capsule Take 20 mg by mouth at bedtime as needed. 06/03/17  Yes [provider]  phenazopyridine (PYRIDIUM) 200 MG tablet Take 1 tablet (200 mg total) by mouth 3 (three) times  daily. 02/09/21  Yes Becky Augustayan, Devone Bonilla, NP  promethazine-dextromethorphan (PROMETHAZINE-DM) 6.25-15 MG/5ML syrup Take 5 mLs by mouth 4 (four) times daily as needed. 01/21/22  Yes Becky Augustayan, Giavonni Cizek, NP    Family History Family History  Problem Relation Age of Onset   Breast cancer Paternal Grandmother        4270's had mastectomy. possibly for precancerous   Healthy Mother    Healthy Father     Social History Social History   Tobacco Use   Smoking status: Never   Smokeless tobacco: Never  Vaping Use   Vaping Use: Never used  Substance Use Topics   Alcohol use: Yes    Comment: OCcassionally   Drug use: No     Allergies   Claritin [loratadine]   Review of Systems Review of Systems  Constitutional:  Negative for fever.  HENT:  Positive for congestion, ear pain and sinus pressure.   Respiratory:  Positive for cough and shortness of breath. Negative for wheezing.   Gastrointestinal:  Negative for diarrhea, nausea and vomiting.  Skin:  Negative for rash.  Neurological:  Positive for headaches.  Hematological: Negative.   Psychiatric/Behavioral: Negative.      Physical Exam Triage Vital Signs ED Triage Vitals  Enc Vitals Group     BP 01/21/22 0905 116/82     Pulse Rate 01/21/22 0905 83     Resp 01/21/22 0905 18  Temp 01/21/22 0905 98.5 F (36.9 C)     Temp Source 01/21/22 0905 Oral     SpO2 01/21/22 0905 98 %     Weight 01/21/22 0903 230 lb (104.3 kg)     Height 01/21/22 0903 5\' 9"  (1.753 m)     Head Circumference --      Peak Flow --      Pain Score 01/21/22 0902 0     Pain Loc --      Pain Edu? --      Excl. in [place]? --    No data found.  Updated Vital Signs BP 116/82 (BP Location: Left Arm)    Pulse 83    Temp 98.5 F (36.9 C) (Oral)    Resp 18    Ht 5\' 9"  (1.753 m)    Wt 230 lb (104.3 kg)    LMP 12/31/2021    SpO2 98%    BMI 33.97 kg/m   Visual Acuity Right Eye Distance:   Left Eye Distance:   Bilateral Distance:    Right Eye Near:   Left Eye Near:     Bilateral Near:     Physical Exam Vitals and nursing note reviewed.  Constitutional:      General: She is not in acute distress.    Appearance: Normal appearance. She is not ill-appearing.  HENT:     Head: Normocephalic and atraumatic.     Right Ear: Tympanic membrane, ear canal and external ear normal. There is no impacted cerumen.     Left Ear: Tympanic membrane, ear canal and external ear normal. There is no impacted cerumen.     Nose: Congestion and rhinorrhea present.     Mouth/Throat:     Mouth: Mucous membranes are moist.     Pharynx: Oropharynx is clear. Posterior oropharyngeal erythema present.  Cardiovascular:     Rate and Rhythm: Normal rate and regular rhythm.     Pulses: Normal pulses.     Heart sounds: Normal heart sounds. No murmur heard.   No friction rub. No gallop.  Pulmonary:     Effort: Pulmonary effort is normal.     Breath sounds: Normal breath sounds. No wheezing, rhonchi or rales.  Musculoskeletal:     Cervical back: Normal range of motion and neck supple.  Lymphadenopathy:     Cervical: No cervical adenopathy.  Skin:    General: Skin is warm and dry.     Capillary Refill: Capillary refill takes less than 2 seconds.     Findings: No erythema or rash.  Neurological:     General: No focal deficit present.     Mental Status: She is alert and oriented to person, place, and time.  Psychiatric:        Mood and Affect: Mood normal.        Behavior: Behavior normal.        Thought Content: Thought content normal.        Judgment: Judgment normal.     UC Treatments / Results  Labs (all labs ordered are listed, but only abnormal results are displayed) Labs Reviewed - No data to display  EKG   Radiology No results found.  Procedures Procedures (including critical care time)  Medications Ordered in UC Medications - No data to display  Initial Impression / Assessment and Plan / UC Course  I have reviewed the triage vital signs and the nursing  notes.  Pertinent labs & imaging results that were available during my care of  the patient were reviewed by me and considered in my medical decision making (see chart for details).  Patient is a very pleasant, nontoxic-appearing 46 year old female who is here for evaluation of respiratory complaints that have been ongoing for the past week as outlined in HPI above.  On physical exam patient has pearly-gray tympanic membranes bilaterally with normal light reflex and clear external auditory canals.  Nasal mucosa is erythematous and edematous with purulent discharge in both nares.  Patient does have very mild tenderness to frontal and maxillary sinuses with percussion.  Oropharyngeal exam reveals mild posterior oropharyngeal erythema and clear postnasal drip.  No cervical lymphadenopathy appreciated on exam.  Cardiopulmonary exam feels clear lung sounds in all fields.  Patient exam is consistent with a viral URI.  She is outside the window for antiviral therapy for both COVID and influenza so I will not test her for either at this point.  I do believe that her upper EXTR infection has become bacterial given the purulent discharge in both nares.  I will do a trial of Augmentin twice daily for 10 days for treatment of her upper respiratory infection.  I will also give her Jerilynn Som she is for cough during the day and Promethazine DM cough syrup use at bedtime.  I have refilled her inhaler as well that she can do 1 to 2 puffs every 4-6 hours as needed for shortness of breath.  ER and return precautions reviewed with patient.   Final Clinical Impressions(s) / UC Diagnoses   Final diagnoses:  Upper respiratory tract infection, unspecified type  Acute cough     Discharge Instructions      Take the Augmentin twice daily for 10 days to treat your URI.   Use the Atrovent nasal spray, 2 squirts in each nostril every 6 hours, as needed for runny nose and postnasal drip.  Use the Tessalon Perles every 8  hours during the day.  Take them with a small sip of water.  They may give you some numbness to the base of your tongue or a metallic taste in your mouth, this is normal.  Use the Promethazine DM cough syrup at bedtime for cough and congestion.  It will make you drowsy so do not take it during the day.  Use the Albuterol inhaler, 2 puffs every 4-6 hours as needed for shortness of breath.  Return for reevaluation or see your primary care provider for any new or worsening symptoms.      ED Prescriptions     Medication Sig Dispense Auth. Provider   amoxicillin-clavulanate (AUGMENTIN) 875-125 MG tablet Take 1 tablet by mouth every 12 (twelve) hours for 10 days. 20 tablet Becky Augusta, NP   albuterol (VENTOLIN HFA) 108 (90 Base) MCG/ACT inhaler Inhale 2 puffs into the lungs every 4 (four) hours as needed. 18 g Becky Augusta, NP   benzonatate (TESSALON) 100 MG capsule Take 2 capsules (200 mg total) by mouth every 8 (eight) hours. 21 capsule Becky Augusta, NP   promethazine-dextromethorphan (PROMETHAZINE-DM) 6.25-15 MG/5ML syrup Take 5 mLs by mouth 4 (four) times daily as needed. 118 mL Becky Augusta, NP      PDMP not reviewed this encounter.   Becky Augusta, NP 01/21/22 [phone]

## 2022-01-21 NOTE — ED Triage Notes (Signed)
Pt c/o chest congestion, ear pain, sinus pressure, headache x1week.

## 2022-09-17 ENCOUNTER — Other Ambulatory Visit: Payer: Self-pay | Admitting: Obstetrics and Gynecology

## 2022-09-17 DIAGNOSIS — Z1231 Encounter for screening mammogram for malignant neoplasm of breast: Secondary | ICD-10-CM

## 2022-10-05 ENCOUNTER — Encounter: Payer: Self-pay | Admitting: Emergency Medicine

## 2022-10-05 ENCOUNTER — Ambulatory Visit
Admission: EM | Admit: 2022-10-05 | Discharge: 2022-10-05 | Disposition: A | Discharge status: Home / Self Care | Payer: Medicaid Other | Attending: Family Medicine | Admitting: Family Medicine

## 2022-10-05 DIAGNOSIS — K6289 Other specified diseases of anus and rectum: Secondary | ICD-10-CM | POA: Diagnosis not present

## 2022-10-05 LAB — OCCULT BLOOD X 1 CARD TO LAB, STOOL: Fecal Occult Bld: NEGATIVE

## 2022-10-05 MED ORDER — CEPHALEXIN 500 MG PO CAPS: 500.0000 mg | ORAL_CAPSULE | Freq: Four times a day (QID) | ORAL | 0 refills | Status: AC

## 2022-10-05 NOTE — ED Triage Notes (Signed)
Patient c/o rectal pain that started last week.  Patient states that she does internal hemmrroids.

## 2022-10-05 NOTE — Discharge Instructions (Addendum)
Stop by the pharmacy to pick up your prescriptions.  Take antibiotics as prescribed.  Follow-up with your gynecologist or gastroenterologist for additional evaluation, as discussed.    Go to ED for red flag symptoms, including; fevers you cannot reduce with Tylenol/Motrin, severe headaches, vision changes, numbness/weakness in part of the body, lethargy, confusion, intractable vomiting, severe dehydration, chest pain, breathing difficulty, severe persistent abdominal or pelvic pain, signs of severe infection (increased redness, swelling of an area), feeling faint or passing out, dizziness, etc. You should especially go to the ED for sudden acute worsening of condition if you do not elect to go at this time.

## 2022-10-05 NOTE — ED Provider Notes (Signed)
[facility]    CSN: [id number] Arrival date & time: 10/05/22  1042      History   Chief Complaint Chief Complaint  Patient presents with   Rectal Pain    HPI Kathy Webb is a 46 y.o. female.   HPI  Kathy Webb presents for rectal pain with discharge and bleeding.  She has "itchy throbbing." States he has history of hemorrhoids and had to "remove the linings" in the past. A week or so ago she found a big and painful hemorrhoid. She performed epsom salt baths and pressed on the area to allow it to pop. States blood and yellow discharge came out.  She has been using suppositories and hemorrhoid cream.The area continues to leak a yellowish discharge.   She feels like it is not going away.  When it was swollen and had a lot of blood before it was painful sitting. No pain with sitting now.  She gets a flare every once in a while. Has seen GYN and GI for polyps and "cleaning out the pockets." Reports no abdominal pain and vaginal discharge. States she does not have anal sex.     Past Medical History:  Diagnosis Date   Hemorrhoids     There are no problems to display for this patient.   Past Surgical History:  Procedure Laterality Date   TONSILLECTOMY      OB History   No obstetric history on file.      Home Medications    Prior to Admission medications   Medication Sig Start Date End Date Taking? Authorizing Provider  cephALEXin (KEFLEX) 500 MG capsule Take 1 capsule (500 mg total) by mouth 4 (four) times daily for 7 days. 10/05/22 10/12/22 Yes Kathy Riederer, DO  escitalopram (LEXAPRO) 10 MG tablet Take 10 mg by mouth at bedtime.   Yes [provider]  levonorgestrel (MIRENA) 20 MCG/24HR IUD 1 each by Intrauterine route once.   Yes [provider]  nortriptyline (PAMELOR) 10 MG capsule Take 20 mg by mouth at bedtime as needed. 06/03/17  Yes [provider]  albuterol (VENTOLIN HFA) 108 (90 Base) MCG/ACT inhaler Inhale 2 puffs into the  lungs every 4 (four) hours as needed. 01/21/22   Kathy Augusta, NP  benzonatate (TESSALON) 100 MG capsule Take 2 capsules (200 mg total) by mouth every 8 (eight) hours. 01/21/22   Kathy Augusta, NP  phenazopyridine (PYRIDIUM) 200 MG tablet Take 1 tablet (200 mg total) by mouth 3 (three) times daily. 02/09/21   Kathy Augusta, NP  promethazine-dextromethorphan (PROMETHAZINE-DM) 6.25-15 MG/5ML syrup Take 5 mLs by mouth 4 (four) times daily as needed. 01/21/22   Kathy Augusta, NP    Family History Family History  Problem Relation Age of Onset   Breast cancer Paternal Grandmother        83's had mastectomy. possibly for precancerous   Healthy Mother    Healthy Father     Social History Social History   Tobacco Use   Smoking status: Never   Smokeless tobacco: Never  Vaping Use   Vaping Use: Never used  Substance Use Topics   Alcohol use: Yes    Comment: OCcassionally   Drug use: No     Allergies   Claritin [loratadine]   Review of Systems Review of Systems :negative unless otherwise stated in HPI.      Physical Exam Triage Vital Signs ED Triage Vitals  Enc Vitals Group     BP 10/05/22 1059 (!) 122/94  Pulse Rate 10/05/22 1059 79     Resp 10/05/22 1059 14     Temp 10/05/22 1059 98.3 F (36.8 C)     Temp Source 10/05/22 1059 Oral     SpO2 10/05/22 1059 99 %     Weight 10/05/22 1056 229 lb 15 oz (104.3 kg)     Height 10/05/22 1056 5\' 9"  (1.753 m)     Head Circumference --      Peak Flow --      Pain Score 10/05/22 1056 0     Pain Loc --      Pain Edu? --      Excl. in [place]? --    No data found.  Updated Vital Signs BP (!) 122/94 (BP Location: Left Arm)   Pulse 79   Temp 98.3 F (36.8 C) (Oral)   Resp 14   Ht 5\' 9"  (1.753 m)   Wt 104.3 kg   SpO2 99%   BMI 33.96 kg/m   Visual Acuity Right Eye Distance:   Left Eye Distance:   Bilateral Distance:    Right Eye Near:   Left Eye Near:    Bilateral Near:     Physical Exam  GEN: pleasant well appearing  female, in no acute distress   CV: regular rate  RESP: no increased work of breathing\ ABD: Bowel sounds present. Soft, non-tender, non-distended.  No guarding, no rebound, no appreciable hepatosplenomegaly RECTAL:no tenderness noted, external hemorrhoids noted that are not thrombosed, sphincter tone normal, stool guaiac negative, no appreciable rectal masses, mustard colored discharge/stool seen on the perineum, no appreciable fissures or sinus tracts GU: vaginal exam: No vaginal discharge or lesions externally, exam chaperoned by RN Kathy Webb MSK: no extremity edema SKIN: warm, dry, no rash on visible skin NEURO: alert, moves all extremities appropriately PSYCH: Normal affect, appropriate speech and behavior   UC Treatments / Results  Labs (all labs ordered are listed, but only abnormal results are displayed) Labs Reviewed  OCCULT BLOOD X 1 CARD TO LAB, STOOL    EKG   Radiology No results found.  Procedures Procedures (including critical care time)  Medications Ordered in UC Medications - No data to display  Initial Impression / Assessment and Plan / UC Course  I have reviewed the triage vital signs and the nursing notes.  Pertinent labs & imaging results that were available during my care of the patient were reviewed by me and considered in my medical decision making (see chart for details).        Patient is a  46 y.o. female with history internal hemorrhoids who presents after having rectal pain for the past 1-2 weeks.  Overall, patient is well-appearing, well-hydrated, and in no acute distress.  Vital signs stable.  Staceyis afebrile.  Rectal exam  with yellowish discharge near the perineum.  Was unable to determine the source of this discharge.  I did not appreciate any sinus tracts or fissures.  She did not have any thrombosed external hemorrhoids.  No appreciable internal hemorrhoids or masses.  Reviewed office note from [facility] by Dr. Leonides Webb, GYN,  from May 2020 which discussed rectal bleeding.  No engorged external hemorrhoids found Dr. Guido Webb exam. History of perineal abscess requiring I&D x 2.  Treat for presumed perineal abscess with Keflex.  Advised patient to follow-up with her gynecologist or GI doctor if symptoms do not improve.  Follow-up, return and ED precautions given.  Discussed MDM, treatment plan and plan for follow-up with patient/parent  who agrees with plan.    Final Clinical Impressions(s) / UC Diagnoses   Final diagnoses:  Rectal pain     Discharge Instructions      Stop by the pharmacy to pick up your prescriptions.  Take antibiotics as prescribed.  Follow-up with your gynecologist or gastroenterologist for additional evaluation, as discussed.    Go to ED for red flag symptoms, including; fevers you cannot reduce with Tylenol/Motrin, severe headaches, vision changes, numbness/weakness in part of the body, lethargy, confusion, intractable vomiting, severe dehydration, chest pain, breathing difficulty, severe persistent abdominal or pelvic pain, signs of severe infection (increased redness, swelling of an area), feeling faint or passing out, dizziness, etc. You should especially go to the ED for sudden acute worsening of condition if you do not elect to go at this time.       ED Prescriptions     Medication Sig Dispense Auth. Provider   cephALEXin (KEFLEX) 500 MG capsule Take 1 capsule (500 mg total) by mouth 4 (four) times daily for 7 days. 28 capsule [place] Lapaglia, DO      PDMP not reviewed this encounter.   Lyndee Hensen, DO 10/05/22 1706

## 2022-11-29 ENCOUNTER — Ambulatory Visit: Payer: Self-pay

## 2022-12-05 DIAGNOSIS — N393 Stress incontinence (female) (male): Secondary | ICD-10-CM | POA: Insufficient documentation

## 2022-12-05 DIAGNOSIS — N360 Urethral fistula: Secondary | ICD-10-CM | POA: Insufficient documentation

## 2023-01-29 ENCOUNTER — Telehealth: Payer: Medicaid Other | Admitting: Physician Assistant

## 2023-01-29 DIAGNOSIS — B9689 Other specified bacterial agents as the cause of diseases classified elsewhere: Secondary | ICD-10-CM

## 2023-01-29 DIAGNOSIS — J208 Acute bronchitis due to other specified organisms: Secondary | ICD-10-CM | POA: Diagnosis not present

## 2023-01-29 MED ORDER — BENZONATATE 100 MG PO CAPS: 100.0000 mg | ORAL_CAPSULE | Freq: Three times a day (TID) | ORAL | 0 refills | Status: DC | PRN

## 2023-01-29 MED ORDER — DOXYCYCLINE HYCLATE 100 MG PO TABS: 100.0000 mg | ORAL_TABLET | Freq: Two times a day (BID) | ORAL | 0 refills | Status: DC

## 2023-01-29 NOTE — Patient Instructions (Signed)
Kathy Webb, thank you for joining Leeanne Rio, [name] for today's virtual visit.  While this provider is not your primary care provider (PCP), if your PCP is located in our provider database this encounter information will be shared with them immediately following your visit.   [facility] account gives you access to today's visit and all your visits, tests, and labs performed at [facility] " click here if you don't have a [facility] account or go to [email or web address]  Consent: (Patient) Kathy Webb provided verbal consent for this virtual visit at the beginning of the encounter.  Current Medications:  Current Outpatient Medications:    albuterol (VENTOLIN HFA) 108 (90 Base) MCG/ACT inhaler, Inhale 2 puffs into the lungs every 4 (four) hours as needed., Disp: 18 g, Rfl: 0   benzonatate (TESSALON) 100 MG capsule, Take 2 capsules (200 mg total) by mouth every 8 (eight) hours., Disp: 21 capsule, Rfl: 0   escitalopram (LEXAPRO) 10 MG tablet, Take 10 mg by mouth at bedtime., Disp: , Rfl:    levonorgestrel (MIRENA) 20 MCG/24HR IUD, 1 each by Intrauterine route once., Disp: , Rfl:    nortriptyline (PAMELOR) 10 MG capsule, Take 20 mg by mouth at bedtime as needed., Disp: , Rfl:    phenazopyridine (PYRIDIUM) 200 MG tablet, Take 1 tablet (200 mg total) by mouth 3 (three) times daily., Disp: 6 tablet, Rfl: 0   promethazine-dextromethorphan (PROMETHAZINE-DM) 6.25-15 MG/5ML syrup, Take 5 mLs by mouth 4 (four) times daily as needed., Disp: 118 mL, Rfl: 0   Medications ordered in this encounter:  No orders of the defined types were placed in this encounter.    *If you need refills on other medications prior to your next appointment, please contact your pharmacy*  Follow-Up: Call back or seek an in-person evaluation if the symptoms worsen or if the condition fails to improve as anticipated.  [place] [phone]  Other  Instructions Take antibiotic (Doxycycline) as directed.  Increase fluids.  Get plenty of rest. Use Mucinex for congestion. Use the Tessalon as directed. Take a daily probiotic (I recommend Align or Culturelle, but even Activia Yogurt may be beneficial).  A humidifier placed in the bedroom may offer some relief for a dry, scratchy throat of nasal irritation.  Read information below on acute bronchitis. Please call or return to clinic if symptoms are not improving.  Acute Bronchitis Bronchitis is when the airways that extend from the windpipe into the lungs get red, puffy, and painful (inflamed). Bronchitis often causes thick spit (mucus) to develop. This leads to a cough. A cough is the most common symptom of bronchitis. In acute bronchitis, the condition usually begins suddenly and goes away over time (usually in 2 weeks). Smoking, allergies, and asthma can make bronchitis worse. Repeated episodes of bronchitis may cause more lung problems.  HOME CARE Rest. Drink enough fluids to keep your pee (urine) clear or pale yellow (unless you need to limit fluids as told by your doctor). Only take over-the-counter or prescription medicines as told by your doctor. Avoid smoking and secondhand smoke. These can make bronchitis worse. If you are a smoker, think about using nicotine gum or skin patches. Quitting smoking will help your lungs heal faster. Reduce the chance of getting bronchitis again by: Washing your hands often. Avoiding people with cold symptoms. Trying not to touch your hands to your mouth, nose, or eyes. Follow up with your doctor as told.  GET HELP IF:  Your symptoms do not improve after 1 week of treatment. Symptoms include: Cough. Fever. Coughing up thick spit. Body aches. Chest congestion. Chills. Shortness of breath. Sore throat.  GET HELP RIGHT AWAY IF:  You have an increased fever. You have chills. You have severe shortness of breath. You have bloody thick spit  (sputum). You throw up (vomit) often. You lose too much body fluid (dehydration). You have a severe headache. You faint.  MAKE SURE YOU:  Understand these instructions. Will watch your condition. Will get help right away if you are not doing well or get worse. Document Released: 06/02/2008 Document Revised: 08/17/2013 Document Reviewed: 06/07/2013 [facility] Patient Information 2015 [place], [place]. This information is not intended to replace advice given to you by your health care provider. Make sure you discuss any questions you have with your health care provider.    If you have been instructed to have an in-person evaluation today at a local Urgent Care facility, please use the link below. It will take you to a list of all of our available [facility], including address, phone number and hours of operation. Please do not delay care.  [facility]  If you or a family member do not have a primary care provider, use the link below to schedule a visit and establish care. When you choose a [facility] primary care physician or advanced practice provider, you gain a long-term partner in health. Find a Primary Care Provider  Learn more about [facility]'s in-office and virtual care options: [facility]

## 2023-01-29 NOTE — Progress Notes (Signed)
Virtual Visit Consent   Kathy Webb, you are scheduled for a virtual visit with a [facility] provider today. Just as with appointments in the office, your consent must be obtained to participate. Your consent will be active for this visit and any virtual visit you may have with one of our providers in the next 365 days. If you have a MyChart account, a copy of this consent can be sent to you electronically.  As this is a virtual visit, video technology does not allow for your provider to perform a traditional examination. This may limit your provider's ability to fully assess your condition. If your provider identifies any concerns that need to be evaluated in person or the need to arrange testing (such as labs, EKG, etc.), we will make arrangements to do so. Although advances in technology are sophisticated, we cannot ensure that it will always work on either your end or our end. If the connection with a video visit is poor, the visit may have to be switched to a telephone visit. With either a video or telephone visit, we are not always able to ensure that we have a secure connection.  By engaging in this virtual visit, you consent to the provision of healthcare and authorize for your insurance to be billed (if applicable) for the services provided during this visit. Depending on your insurance coverage, you may receive a charge related to this service.  I need to obtain your verbal consent now. Are you willing to proceed with your visit today? Kathy Webb has provided verbal consent on 01/29/2023 for a virtual visit (video or telephone). Leeanne Rio, [place]  Date: 01/29/2023 9:49 AM  Virtual Visit via Video Note   I, Leeanne Rio, connected with  Kathy Webb  ([id number], 1976-11-23) on 01/29/23 at  9:30 AM EST by a video-enabled telemedicine application and verified that I am speaking with the correct person using two identifiers.  Location: Patient: Virtual Visit Location  Patient: Home Provider: Virtual Visit Location Provider: Home Office   I discussed the limitations of evaluation and management by telemedicine and the availability of in person appointments. The patient expressed understanding and agreed to proceed.    History of Present Illness: Kathy Webb is a 47 y.o. who identifies as a female who was assigned female at birth, and is being seen today for concern of bronchitis. Has had issue with nasal congestion, sinus pressure, chest congestion and cough. Worsening over weekend with thick, colored phlegm. Denies fever, chills. Notes since yesterday she cannot smell or taste. Denies any known COVID exposures. She did test for COVID initially and was negative. Is taking OTC Mucinex-DM.   HPI: HPI  Problems: There are no problems to display for this patient.   Allergies:  Allergies  Allergen Reactions   Claritin [Loratadine] Diarrhea   Medications:  Current Outpatient Medications:    benzonatate (TESSALON) 100 MG capsule, Take 1 capsule (100 mg total) by mouth 3 (three) times daily as needed for cough., Disp: 30 capsule, Rfl: 0   doxycycline (VIBRA-TABS) 100 MG tablet, Take 1 tablet (100 mg total) by mouth 2 (two) times daily., Disp: 14 tablet, Rfl: 0   albuterol (VENTOLIN HFA) 108 (90 Base) MCG/ACT inhaler, Inhale 2 puffs into the lungs every 4 (four) hours as needed., Disp: 18 g, Rfl: 0   escitalopram (LEXAPRO) 10 MG tablet, Take 10 mg by mouth at bedtime., Disp: , Rfl:    levonorgestrel (MIRENA) 20 MCG/24HR IUD, 1  each by Intrauterine route once., Disp: , Rfl:    nortriptyline (PAMELOR) 10 MG capsule, Take 20 mg by mouth at bedtime as needed., Disp: , Rfl:    phenazopyridine (PYRIDIUM) 200 MG tablet, Take 1 tablet (200 mg total) by mouth 3 (three) times daily., Disp: 6 tablet, Rfl: 0  Observations/Objective: Patient is well-developed, well-nourished in no acute distress.  Resting comfortably at home.  Head is normocephalic, atraumatic.  No  labored breathing. Speech is clear and coherent with logical content.  Patient is alert and oriented at baseline.   Assessment and Plan: 1. Acute bacterial bronchitis - doxycycline (VIBRA-TABS) 100 MG tablet; Take 1 tablet (100 mg total) by mouth 2 (two) times daily.  Dispense: 14 tablet; Refill: 0 - benzonatate (TESSALON) 100 MG capsule; Take 1 capsule (100 mg total) by mouth 3 (three) times daily as needed for cough.  Dispense: 30 capsule; Refill: 0  Feels she has had an ongoing and progressive bronchitis so will start treatment for this. However, giving recent change in taste/smell, want her to retest for COVID to be cautious in case she has the misfortune of having a superimposed infection. Rx Doxycycline.  Increase fluids.  Rest.  Saline nasal spray.  Probiotic.  Mucinex as directed.  Humidifier in bedroom. Tessalon per orders.  Call or return to clinic if symptoms are not improving.   Follow Up Instructions: I discussed the assessment and treatment plan with the patient. The patient was provided an opportunity to ask questions and all were answered. The patient agreed with the plan and demonstrated an understanding of the instructions.  A copy of instructions were sent to the patient via MyChart unless otherwise noted below.   The patient was advised to call back or seek an in-person evaluation if the symptoms worsen or if the condition fails to improve as anticipated.  Time:  I spent 10 minutes with the patient via telehealth technology discussing the above problems/concerns.    Leeanne Rio, [name]

## 2023-03-23 DIAGNOSIS — Z01818 Encounter for other preprocedural examination: Secondary | ICD-10-CM | POA: Insufficient documentation

## 2023-03-25 ENCOUNTER — Ambulatory Visit
Admission: RE | Admit: 2023-03-25 | Discharge: 2023-03-25 | Disposition: A | Discharge status: Home / Self Care | Payer: Medicaid Other | Source: Ambulatory Visit | Attending: Family Medicine | Admitting: Family Medicine

## 2023-03-25 ENCOUNTER — Ambulatory Visit (INDEPENDENT_AMBULATORY_CARE_PROVIDER_SITE_OTHER): Payer: Medicaid Other

## 2023-03-25 VITALS — BP 135/85 | HR 85 | Temp 98.1°F | Resp 16

## 2023-03-25 DIAGNOSIS — R102 Pelvic and perineal pain: Secondary | ICD-10-CM | POA: Insufficient documentation

## 2023-03-25 DIAGNOSIS — Z8719 Personal history of other diseases of the digestive system: Secondary | ICD-10-CM | POA: Insufficient documentation

## 2023-03-25 DIAGNOSIS — E669 Obesity, unspecified: Secondary | ICD-10-CM | POA: Insufficient documentation

## 2023-03-25 DIAGNOSIS — J069 Acute upper respiratory infection, unspecified: Secondary | ICD-10-CM

## 2023-03-25 DIAGNOSIS — K611 Rectal abscess: Secondary | ICD-10-CM | POA: Insufficient documentation

## 2023-03-25 DIAGNOSIS — H9193 Unspecified hearing loss, bilateral: Secondary | ICD-10-CM | POA: Insufficient documentation

## 2023-03-25 DIAGNOSIS — N83201 Unspecified ovarian cyst, right side: Secondary | ICD-10-CM | POA: Insufficient documentation

## 2023-03-25 LAB — URINALYSIS, ROUTINE W REFLEX MICROSCOPIC
Bilirubin Urine: NEGATIVE
Glucose, UA: NEGATIVE mg/dL
Ketones, ur: NEGATIVE mg/dL
Leukocytes,Ua: NEGATIVE
Nitrite: NEGATIVE
Protein, ur: NEGATIVE mg/dL
Specific Gravity, Urine: >1.03 — ABNORMAL HIGH (ref 1.005–1.030)
pH: 6 (ref 5.0–8.0)

## 2023-03-25 LAB — URINALYSIS, MICROSCOPIC (REFLEX)

## 2023-03-25 NOTE — ED Triage Notes (Signed)
Patient presents to [facility] for possible UTI. Reports Monday she had blood work and labs were abnormal on 03/25. They instructed her to come in for evaluation to rule out UTI.

## 2023-03-25 NOTE — Discharge Instructions (Addendum)
Your chest x-ray did not show any signs of pneumonia and your urinalysis did not show any signs of infection.  I do believe you have a viral respiratory infection based upon your inflamed nasal mucosa and the clear mucus that I see on your exam.  Viral infections generally run their course in 7 to 10 days.  If you develop any new or worsening symptoms either return for reevaluation or see your primary care provider.

## 2023-03-25 NOTE — ED Provider Notes (Signed)
[facility]    CSN: [id number] Arrival date & time: 03/25/23  1356      History   Chief Complaint Chief Complaint  Patient presents with   Urinary Frequency    Elevated levels on blood work from [facility] anesthesiologist on Monday, 03/23/2023. I am having surgery on 04/09/23 and they want me to.be checked for uti before sugery. - Entered by patient    HPI Kathy Webb is a 47 y.o. female.   HPI  47 year old female with a past medical history significant for migraine with aura, abnormal uterine bleeding, and genital warts, perirectal abscess, perineal fistula, and hemorrhoids presents for evaluation of possible UTI.  She had blood work performed by anesthesia at [facility] on 03/23/2023 which showed an elevated white blood cell count of 14.2 and elevated creatinine of 1.1.  She was advised to come in to be evaluated for possible UTI.  Patient also reports some mild nasal congestion with green nasal discharge and chest tightness with intermittent cough that is nonproductive.  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      Home Medications    Prior to Admission medications   Medication Sig  Start Date End Date Taking? Authorizing Provider  albuterol (VENTOLIN HFA) 108 (90 Base) MCG/ACT inhaler Inhale 2 puffs into the lungs every 4 (four) hours as needed. 01/21/22   Margarette Canada, NP  benzonatate (TESSALON) 100 MG capsule Take 1 capsule (100 mg total) by mouth 3 (three) times daily as needed for cough. 01/29/23   Brunetta Jeans, [name]  doxycycline (VIBRA-TABS) 100 MG tablet Take 1 tablet (100 mg total) by mouth 2 (two) times daily. 01/29/23   Brunetta Jeans, [name]  escitalopram (LEXAPRO) 10 MG tablet Take 10 mg by mouth at bedtime.    [provider]  levonorgestrel (MIRENA) 20 MCG/24HR IUD 1 each by Intrauterine route once.    [provider]  nortriptyline (PAMELOR) 10 MG capsule Take 20 mg by mouth at bedtime as needed. 06/03/17   [provider]  phenazopyridine (PYRIDIUM) 200 MG tablet Take 1 tablet (200 mg total) by mouth 3 (three) times daily. 02/09/21   Margarette Canada, NP    Family History Family History  Problem Relation Age of Onset   Breast cancer Paternal Grandmother        70's had mastectomy. possibly for precancerous   Healthy Mother    Healthy Father     Social History Social History   Tobacco Use   Smoking status: Never   Smokeless tobacco: Never  Vaping Use   Vaping Use: Never used  Substance Use Topics   Alcohol use: Yes    Comment: OCcassionally  Drug use: No     Allergies   Claritin [loratadine]   Review of Systems Review of Systems  Constitutional:  Negative for fever.  HENT:  Positive for congestion and rhinorrhea. Negative for ear pain, sinus pressure and sore throat.   Respiratory:  Positive for cough. Negative for shortness of breath and wheezing.   Genitourinary:  Positive for frequency and urgency. Negative for dysuria, hematuria, vaginal discharge and vaginal pain.  Musculoskeletal:  Negative for back pain.     Physical Exam Triage Vital Signs ED Triage Vitals  Enc Vitals Group     BP 03/25/23 1411  135/85     Pulse Rate 03/25/23 1411 85     Resp 03/25/23 1411 16     Temp 03/25/23 1411 98.1 F (36.7 C)     Temp Source 03/25/23 1411 Oral     SpO2 03/25/23 1411 96 %     Weight --      Height --      Head Circumference --      Peak Flow --      Pain Score 03/25/23 1412 0     Pain Loc --      Pain Edu? --      Excl. in [place]? --    No data found.  Updated Vital Signs BP 135/85 (BP Location: Left Arm)   Pulse 85   Temp 98.1 F (36.7 C) (Oral)   Resp 16   SpO2 96%   Visual Acuity Right Eye Distance:   Left Eye Distance:   Bilateral Distance:    Right Eye Near:   Left Eye Near:    Bilateral Near:     Physical Exam Vitals and nursing note reviewed.  Constitutional:      Appearance: Normal appearance. She is not ill-appearing.  HENT:     Head: Normocephalic and atraumatic.     Right Ear: Tympanic membrane, ear canal and external ear normal. There is no impacted cerumen.     Left Ear: Tympanic membrane, ear canal and external ear normal. There is no impacted cerumen.     Nose: Congestion present. No rhinorrhea.     Comments: Mucosa is erythematous and mildly edematous without any significant discharge noted.    Mouth/Throat:     Mouth: Mucous membranes are moist.     Pharynx: Oropharynx is clear. No oropharyngeal exudate or posterior oropharyngeal erythema.  Cardiovascular:     Rate and Rhythm: Normal rate and regular rhythm.     Pulses: Normal pulses.     Heart sounds: Normal heart sounds. No murmur heard.    No friction rub. No gallop.  Pulmonary:     Effort: Pulmonary effort is normal.     Breath sounds: Normal breath sounds. No wheezing, rhonchi or rales.  Abdominal:     Tenderness: There is no right CVA tenderness or left CVA tenderness.  Musculoskeletal:     Cervical back: Normal range of motion and neck supple.  Lymphadenopathy:     Cervical: No cervical adenopathy.  Skin:    General: Skin is warm and dry.     Capillary Refill: Capillary refill takes  less than 2 seconds.  Neurological:     Mental Status: She is alert.      UC Treatments / Results  Labs (all labs ordered are listed, but only abnormal results are displayed) Labs Reviewed  URINALYSIS, ROUTINE W REFLEX MICROSCOPIC - Abnormal; Notable for the following components:      Result Value  Specific Gravity, Urine >1.030 (*)    Hgb urine dipstick SMALL (*)    All other components within normal limits  URINALYSIS, MICROSCOPIC (REFLEX) - Abnormal; Notable for the following components:   Bacteria, UA FEW (*)    All other components within normal limits    EKG   Radiology DG Chest 2 View  Result Date: 03/25/2023 CLINICAL DATA:  Cough, urinary tract infection EXAM: CHEST - 2 VIEW COMPARISON:  None Available. FINDINGS: Frontal and lateral views of the chest are obtained on 3 images. The cardiac silhouette is unremarkable. No airspace disease, effusion, or pneumothorax. No acute bony abnormalities. IMPRESSION: 1. No acute intrathoracic process. Electronically Signed   By: Randa Ngo M.D.   On: 03/25/2023 14:52    Procedures Procedures (including critical care time)  Medications Ordered in UC Medications - No data to display  Initial Impression / Assessment and Plan / UC Course  I have reviewed the triage vital signs and the nursing notes.  Pertinent labs & imaging results that were available during my care of the patient were reviewed by me and considered in my medical decision making (see chart for details).   Patient is a pleasant 47 year old female presenting for evaluation of an elevated white blood cell count that was found with routine lab work for an upcoming surgical procedure.  On 04/09/2023 she is going to have a bladder sling placed to help with her stress incontinence.  At her anesthesia visit she had an elevated white blood cell count of 14.6 and also an elevated creatinine of 1.1.  Anesthesia told her to come to urgent care to be evaluated for possible UTI  or other possible sources of infection.  Patient is largely asymptomatic though she does admit to some nasal congestion with green nasal discharge that has been up-and-down with the weather change.  She also has an infrequent nonproductive cough.  No shortness of breath or wheezing and no fevers.  She denies pain with urination but she has had some urinary urgency and frequency.  She has not seen any blood in her urine, cloudiness to her urine, and denies low back pain.  She also denies vaginal itching or discharge.  On exam does have inflamed nasal mucosa but no purulent discharge noted in either nare.  No cervical adenopathy present on exam and cardiopulmonary exam is benign.  I will order urinalysis to look for presence of UTI as well as a chest x-ray to screen for any developing cardiopulmonary pathology.  Urinalysis shows a high specific gravity of greater than 1.030 and small hemoglobin.  Negative for leukocyte esterase, nitrates, or protein.  Reflex microscopy shows 6-10 RBCs with few bacteria.  0-5 WBCs.  Chest x-ray the reading by radiology states there is no acute intrathoracic process.  The patient does not have a urinary tract infection or pulmonary infection.  She does have signs of a viral upper respiratory infection which could be the reason for her elevated white blood cell count.  I will discharge her home with diagnosis of viral URI and have her continue to monitor her symptoms and follow-up with her PCP for any new or worsening ones.   Final Clinical Impressions(s) / UC Diagnoses   Final diagnoses:  Viral URI     Discharge Instructions      Your chest x-ray did not show any signs of pneumonia and your urinalysis did not show any signs of infection.  I do believe you have a viral respiratory infection based upon  your inflamed nasal mucosa and the clear mucus that I see on your exam.  Viral infections generally run their course in 7 to 10 days.  If you develop any new or  worsening symptoms either return for reevaluation or see your primary care provider.     ED Prescriptions   None    PDMP not reviewed this encounter.   Margarette Canada, NP 03/25/23 1505

## 2023-05-26 ENCOUNTER — Ambulatory Visit
Admission: RE | Admit: 2023-05-26 | Discharge: 2023-05-26 | Disposition: A | Discharge status: Home / Self Care | Payer: Medicaid Other | Source: Ambulatory Visit | Attending: Internal Medicine | Admitting: Internal Medicine

## 2023-06-17 ENCOUNTER — Ambulatory Visit
Admission: RE | Admit: 2023-06-17 | Discharge: 2023-06-17 | Disposition: A | Discharge status: Home / Self Care | Payer: Medicaid Other | Source: Ambulatory Visit | Attending: Obstetrics and Gynecology | Admitting: Obstetrics and Gynecology

## 2023-06-17 DIAGNOSIS — Z1231 Encounter for screening mammogram for malignant neoplasm of breast: Secondary | ICD-10-CM | POA: Insufficient documentation

## 2023-12-04 ENCOUNTER — Ambulatory Visit
Admission: RE | Admit: 2023-12-04 | Discharge: 2023-12-04 | Disposition: A | Discharge status: Home / Self Care | Payer: Medicaid Other | Source: Ambulatory Visit | Attending: Physician Assistant

## 2023-12-04 VITALS — BP 114/77 | HR 84 | Temp 98.3°F | Resp 17

## 2023-12-04 DIAGNOSIS — R300 Dysuria: Secondary | ICD-10-CM

## 2023-12-04 DIAGNOSIS — J3489 Other specified disorders of nose and nasal sinuses: Secondary | ICD-10-CM | POA: Diagnosis not present

## 2023-12-04 DIAGNOSIS — N760 Acute vaginitis: Secondary | ICD-10-CM | POA: Diagnosis not present

## 2023-12-04 DIAGNOSIS — B9689 Other specified bacterial agents as the cause of diseases classified elsewhere: Secondary | ICD-10-CM

## 2023-12-04 LAB — URINALYSIS, W/ REFLEX TO CULTURE (INFECTION SUSPECTED)
Bilirubin Urine: NEGATIVE
Glucose, UA: NEGATIVE mg/dL
Ketones, ur: NEGATIVE mg/dL
Leukocytes,Ua: NEGATIVE
Nitrite: NEGATIVE
Protein, ur: NEGATIVE mg/dL
Specific Gravity, Urine: 1.015 (ref 1.005–1.030)
WBC, UA: NONE SEEN WBC/hpf (ref 0–5)
pH: 5.5 (ref 5.0–8.0)

## 2023-12-04 LAB — WET PREP, GENITAL
Sperm: NONE SEEN
Trich, Wet Prep: NONE SEEN
WBC, Wet Prep HPF POC: <10 (ref <10)
Yeast Wet Prep HPF POC: NONE SEEN

## 2023-12-04 MED ORDER — METRONIDAZOLE 500 MG PO TABS: 500.0000 mg | ORAL_TABLET | Freq: Two times a day (BID) | ORAL | 0 refills | Status: AC

## 2023-12-04 NOTE — ED Triage Notes (Signed)
Patient states that she got a UTI kit from [facility] for UTI  Fatigue-burning with urination  Sinus pressure

## 2023-12-04 NOTE — Discharge Instructions (Signed)
The most common types of vaginal infections are yeast infections and bacterial vaginosis. Neither of which are really considered to be sexually transmitted. Often a pH swab or wet prep is performed and if abnormal may reveal either type of infection. Begin metronidazole if prescribed for possible BV infection. If there is concern for yeast infection, fluconazole is often prescribed . Take this as directed. You may also apply topical miconazole (can be purchased OTC) externally for relief of itching. Increase rest and fluid intake. If labs sent out, we will call within 2-5 days with results and amend treatment if necessary. Always try to use pH balanced washes/wipes, urinate after intercourse, stay hydrated, and take probiotics if you are prone to vaginal infections. Return or see PCP or gynecologist for new/worsening infections.    -Will call you in a couple of days if your urine culture shows bacteria and send additional antibiotics.

## 2023-12-04 NOTE — ED Provider Notes (Signed)
[facility]    CSN: [id number] Arrival date & time: 12/04/23  0825      History   Chief Complaint Chief Complaint  Patient presents with   Urinary Frequency    I have a uti - Entered by patient   Nausea   facial pressure    HPI Kathy Webb is a 47 y.o. female presenting for dysuria, frequency, urgency, nausea, headaches, low back aching, and fatigue for the past couple days.  Also reports vaginal itching.  She reports purchasing a UTI test over-the-counter and states that was consistent with urinary tract infection.  Denies fever, abdominal pain, flank pain, hematuria, vaginal discharge/odor and no report of any concern for STIs.  Patient says her symptoms feel consistent with a urinary infection.  Reports getting a couple UTIs per year.  Also reports sinus pressure and nasal congestion x 1 week. No sore throat or cough.   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      Home Medications    Prior to Admission medications   Medication Sig Start Date End Date Taking? Authorizing Provider  escitalopram (LEXAPRO) 10 MG tablet Take 10 mg by mouth at  bedtime.   Yes [provider]  levonorgestrel (MIRENA) 20 MCG/24HR IUD 1 each by Intrauterine route once.   Yes [provider]  metroNIDAZOLE (FLAGYL) 500 MG tablet Take 1 tablet (500 mg total) by mouth 2 (two) times daily for 7 days. 12/04/23 12/11/23 Yes Shirlee Latch, [name]  nortriptyline (PAMELOR) 10 MG capsule Take 20 mg by mouth at bedtime as needed. 06/03/17  Yes [provider]  albuterol (VENTOLIN HFA) 108 (90 Base) MCG/ACT inhaler Inhale 2 puffs into the lungs every 4 (four) hours as needed. 01/21/22   Becky Augusta, NP  benzonatate (TESSALON) 100 MG capsule Take 1 capsule (100 mg total) by mouth 3 (three) times daily as needed for cough. 01/29/23   Waldon Merl, [name]  doxycycline (VIBRA-TABS) 100 MG tablet Take 1 tablet (100 mg total) by mouth 2 (two) times daily. 01/29/23   Waldon Merl, [name]  phenazopyridine (PYRIDIUM) 200 MG tablet Take 1 tablet (200 mg total) by mouth 3 (three) times daily. 02/09/21   Becky Augusta, NP    Family History Family History  Problem Relation Age of Onset   Breast cancer Paternal Grandmother        32's had mastectomy. possibly for precancerous   Healthy Mother    Healthy Father     Social History Social History   Tobacco Use   Smoking status: Never   Smokeless tobacco: Never  Vaping Use   Vaping status:  Never Used  Substance Use Topics   Alcohol use: Yes    Comment: OCcassionally   Drug use: No     Allergies   Claritin [loratadine]   Review of Systems Review of Systems  Constitutional:  Positive for fatigue. Negative for chills and fever.  HENT:  Positive for congestion and sinus pressure. Negative for rhinorrhea and sore throat.   Respiratory:  Negative for cough and shortness of breath.   Gastrointestinal:  Positive for nausea. Negative for abdominal pain, diarrhea and vomiting.  Genitourinary:  Positive for dysuria, frequency and urgency. Negative for decreased urine volume, flank pain, hematuria,  pelvic pain, vaginal bleeding, vaginal discharge and vaginal pain.       + Vaginal itching  Musculoskeletal:  Negative for back pain.  Skin:  Negative for rash.  Neurological:  Positive for headaches.     Physical Exam Triage Vital Signs ED Triage Vitals  Encounter Vitals Group     BP      Systolic BP Percentile      Diastolic BP Percentile      Pulse      Resp      Temp      Temp src      SpO2      Weight      Height      Head Circumference      Peak Flow      Pain Score      Pain Loc      Pain Education      Exclude from Growth Chart    No data found.  Updated Vital Signs BP 114/77 (BP Location: Right Arm)   Pulse 84   Temp 98.3 F (36.8 C) (Oral)   Resp 17   SpO2 98%      Physical Exam Vitals and nursing note reviewed.  Constitutional:      General: She is not in acute distress.    Appearance: Normal appearance. She is not ill-appearing or toxic-appearing.  HENT:     Head: Normocephalic and atraumatic.     Nose: Congestion present.     Mouth/Throat:     Mouth: Mucous membranes are moist.     Pharynx: Oropharynx is clear.  Eyes:     General: No scleral icterus.       Right eye: No discharge.        Left eye: No discharge.     Conjunctiva/sclera: Conjunctivae normal.  Cardiovascular:     Rate and Rhythm: Normal rate and regular rhythm.     Heart sounds: Normal heart sounds.  Pulmonary:     Effort: Pulmonary effort is normal. No respiratory distress.     Breath sounds: Normal breath sounds.  Abdominal:     Palpations: Abdomen is soft.     Tenderness: There is abdominal tenderness (suprapubic). There is no right CVA tenderness or left CVA tenderness.  Musculoskeletal:     Cervical back: Neck supple.  Skin:    General: Skin is dry.  Neurological:     General: No focal deficit present.     Mental Status: She is alert. Mental status is at baseline.     Motor: No weakness.     Gait: Gait normal.  Psychiatric:        Mood and Affect: Mood normal.         Behavior: Behavior normal.      UC Treatments / Results  Labs (all labs ordered are listed, but only abnormal results are displayed)  Labs Reviewed  WET PREP, GENITAL - Abnormal; Notable for the following components:      Result Value   Clue Cells Wet Prep HPF POC PRESENT (*)    All other components within normal limits  URINALYSIS, W/ REFLEX TO CULTURE (INFECTION SUSPECTED) - Abnormal; Notable for the following components:   Hgb urine dipstick SMALL (*)    Bacteria, UA RARE (*)    All other components within normal limits  URINE CULTURE    EKG   Radiology No results found.  Procedures Procedures (including critical care time)  Medications Ordered in UC Medications - No data to display  Initial Impression / Assessment and Plan / UC Course  I have reviewed the triage vital signs and the nursing notes.  Pertinent labs & imaging results that were available during my care of the patient were reviewed by me and considered in my medical decision making (see chart for details).   47 year old female presents for dysuria, frequency and urgency for the past couple days.  Also reports vaginal itching.  No associated fever, abdominal pain, flank pain, vaginal discharge.  Feels symptoms consistent with a UTI.  Additionally reporting nasal congestion and postnasal drainage for the past week or so.  Vitals are normal and stable.  She is overall well-appearing.  No abdominal tenderness and no CVA tenderness.  Urinalysis obtained.  UA not consistent with UTI but will send for culture.  Will obtain a wet prep to assess for vaginal infection given complaint of itching with dysuria.  Wet prep shows clue cells.  Will treat for bacterial vaginosis which could be causing her symptoms.  Advised we will add additional antibiotics if cultures positive in a couple of days.  In regards to her sinus pressure and nasal congestion, likely viral versus allergies.  Advised to continue decongestants  and nasal sprays.   Final Clinical Impressions(s) / UC Diagnoses   Final diagnoses:  Bacterial vaginosis  Dysuria  Sinus pressure     Discharge Instructions      The most common types of vaginal infections are yeast infections and bacterial vaginosis. Neither of which are really considered to be sexually transmitted. Often a pH swab or wet prep is performed and if abnormal may reveal either type of infection. Begin metronidazole if prescribed for possible BV infection. If there is concern for yeast infection, fluconazole is often prescribed . Take this as directed. You may also apply topical miconazole (can be purchased OTC) externally for relief of itching. Increase rest and fluid intake. If labs sent out, we will call within 2-5 days with results and amend treatment if necessary. Always try to use pH balanced washes/wipes, urinate after intercourse, stay hydrated, and take probiotics if you are prone to vaginal infections. Return or see PCP or gynecologist for new/worsening infections.    -Will call you in a couple of days if your urine culture shows bacteria and send additional antibiotics.     ED Prescriptions     Medication Sig Dispense Auth. Provider   metroNIDAZOLE (FLAGYL) 500 MG tablet Take 1 tablet (500 mg total) by mouth 2 (two) times daily for 7 days. 14 tablet Gareth Morgan      PDMP not reviewed this encounter.   Shirlee Latch, [name] 12/04/23 1014

## 2023-12-05 LAB — URINE CULTURE: Culture: NO GROWTH

## 2023-12-07 ENCOUNTER — Ambulatory Visit: Payer: Medicaid Other

## 2023-12-07 DIAGNOSIS — K640 First degree hemorrhoids: Secondary | ICD-10-CM | POA: Diagnosis not present

## 2023-12-07 DIAGNOSIS — Z1211 Encounter for screening for malignant neoplasm of colon: Secondary | ICD-10-CM | POA: Diagnosis not present

## 2024-01-28 ENCOUNTER — Ambulatory Visit
Admission: RE | Admit: 2024-01-28 | Discharge: 2024-01-28 | Disposition: A | Discharge status: Home / Self Care | Payer: Medicaid Other | Source: Ambulatory Visit | Attending: Emergency Medicine | Admitting: Emergency Medicine

## 2024-01-28 VITALS — BP 125/89 | HR 74 | Temp 98.7°F | Resp 19

## 2024-01-28 DIAGNOSIS — J069 Acute upper respiratory infection, unspecified: Secondary | ICD-10-CM | POA: Diagnosis not present

## 2024-01-28 MED ORDER — PROMETHAZINE-DM 6.25-15 MG/5ML PO SYRP: 5.0000 mL | ORAL_SOLUTION | Freq: Four times a day (QID) | ORAL | 0 refills | Status: AC | PRN

## 2024-01-28 MED ORDER — IPRATROPIUM BROMIDE 0.06 % NA SOLN: 2.0000 | Freq: Four times a day (QID) | NASAL | 12 refills | Status: AC

## 2024-01-28 MED ORDER — AMOXICILLIN-POT CLAVULANATE 875-125 MG PO TABS: 1.0000 | ORAL_TABLET | Freq: Two times a day (BID) | ORAL | 0 refills | Status: AC

## 2024-01-28 MED ORDER — BENZONATATE 100 MG PO CAPS: 200.0000 mg | ORAL_CAPSULE | Freq: Three times a day (TID) | ORAL | 0 refills | Status: AC

## 2024-01-28 NOTE — ED Triage Notes (Addendum)
Sx x 2-3 weeks  have been having chills, headache,  diarrhea,  fatigue, the last two weeks off and on. I think it's a combination of getting off of meds and upper respiratory infection.  I have been taking over counter meds and mucus thinning meds, not helping    Patient reads lips and needs mask pulled down

## 2024-01-28 NOTE — Discharge Instructions (Addendum)
Take the Augmentin twice daily with food for 7 days for treatment of your upper respiratory infection.  Use over-the-counter Tylenol and/or ibuprofen according the package instructions as needed for any fever or pain.  Use the Atrovent nasal spray, 2 squirts in each nostril every 6 hours, as needed for runny nose and postnasal drip.  Use the Tessalon Perles every 8 hours during the day.  Take them with a small sip of water.  They may give you some numbness to the base of your tongue or a metallic taste in your mouth, this is normal.  Use the Promethazine DM cough syrup at bedtime for cough and congestion.  It will make you drowsy so do not take it during the day.  Return for reevaluation or see your primary care provider for any new or worsening symptoms.

## 2024-01-28 NOTE — ED Provider Notes (Signed)
[facility]    CSN: [id number] Arrival date & time: 01/28/24  0102      History   Chief Complaint Chief Complaint  Patient presents with   Generalized Body Aches    I have been having chills, headache,  diarrhea,  fatigue, the last two weeks off and on. I think it's a combination of getting off of meds and upper respiratory infection.  I have been taking over counter meds and mucus thinning meds, not helping. - Entered by patient   Diarrhea   Headache   Cough   Fatigue    HPI Kathy Webb is a 48 y.o. female.   HPI  48 year old female with past medical history significant for bilateral hearing loss, migraine with aura, depression, obesity, and pelvic pain at female presents for evaluation of 2 to 3 weeks worth of respiratory symptoms to include headache, chills, fatigue, and diarrhea.  She has been using over-the-counter medications without relief of symptoms.  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      Home Medications    Prior to Admission medications   Medication Sig Start Date End Date Taking? Authorizing Provider  amoxicillin-clavulanate  (AUGMENTIN) 875-125 MG tablet Take 1 tablet by mouth every 12 (twelve) hours for 7 days. 01/28/24 02/04/24 Yes Becky Augusta, NP  benzonatate (TESSALON) 100 MG capsule Take 2 capsules (200 mg total) by mouth every 8 (eight) hours. 01/28/24  Yes Becky Augusta, NP  ipratropium (ATROVENT) 0.06 % nasal spray Place 2 sprays into both nostrils 4 (four) times daily. 01/28/24  Yes Becky Augusta, NP  promethazine-dextromethorphan (PROMETHAZINE-DM) 6.25-15 MG/5ML syrup Take 5 mLs by mouth 4 (four) times daily as needed. 01/28/24  Yes Becky Augusta, NP  albuterol (VENTOLIN HFA) 108 (90 Base) MCG/ACT inhaler Inhale 2 puffs into the lungs every 4 (four) hours as needed. 01/21/22   Becky Augusta, NP  escitalopram (LEXAPRO) 10 MG tablet Take 10 mg by mouth at bedtime.    [provider]  levonorgestrel (MIRENA) 20 MCG/24HR IUD 1 each by Intrauterine route once.   Yes [provider]  nortriptyline (PAMELOR) 10 MG capsule Take 20 mg by mouth at bedtime as needed. 06/03/17   [provider]    Family History Family History  Problem Relation Age of Onset   Breast cancer Paternal Grandmother        21's had mastectomy. possibly for precancerous   Healthy Mother    Healthy Father     Social History Social History   Tobacco Use   Smoking status: Never   Smokeless tobacco: Never  Vaping Use   Vaping status: Never Used  Substance Use  Topics   Alcohol use: Yes    Comment: OCcassionally   Drug use: No     Allergies   Claritin [loratadine]   Review of Systems Review of Systems  Constitutional:  Negative for fever.  HENT:  Positive for congestion, rhinorrhea, sinus pressure and sneezing.   Respiratory:  Positive for cough. Negative for shortness of breath and wheezing.      Physical Exam Triage Vital Signs ED Triage Vitals  Encounter Vitals Group     BP      Systolic BP Percentile      Diastolic BP Percentile      Pulse      Resp      Temp      Temp src      SpO2       Weight      Height      Head Circumference      Peak Flow      Pain Score      Pain Loc      Pain Education      Exclude from Growth Chart    No data found.  Updated Vital Signs BP 125/89 (BP Location: Left Arm)   Pulse 74   Temp 98.7 F (37.1 C) (Oral)   Resp 19   SpO2 100%   Visual Acuity Right Eye Distance:   Left Eye Distance:   Bilateral Distance:    Right Eye Near:   Left Eye Near:    Bilateral Near:     Physical Exam Vitals and nursing note reviewed.  Constitutional:      Appearance: Normal appearance. She is not ill-appearing.  HENT:     Head: Normocephalic and atraumatic.     Nose: Congestion and rhinorrhea present.     Comments: Please mucosa is erythematous and edematous with thick yellow bloody discharge in both nares.  Sinuses are nontender to compression.    Mouth/Throat:     Mouth: Mucous membranes are moist.     Pharynx: Oropharynx is clear. No oropharyngeal exudate or posterior oropharyngeal erythema.  Cardiovascular:     Rate and Rhythm: Normal rate and regular rhythm.     Pulses: Normal pulses.     Heart sounds: Normal heart sounds. No murmur heard.    No friction rub. No gallop.  Pulmonary:     Effort: Pulmonary effort is normal.     Breath sounds: Normal breath sounds. No wheezing or rales.  Chest:     Chest wall: No tenderness.  Musculoskeletal:     Cervical back: Normal range of motion and neck supple. No tenderness.  Lymphadenopathy:     Cervical: No cervical adenopathy.  Skin:    General: Skin is warm and dry.     Capillary Refill: Capillary refill takes less than 2 seconds.     Findings: No rash.  Neurological:     General: No focal deficit present.     Mental Status: She is alert and oriented to person, place, and time.      UC Treatments / Results  Labs (all labs ordered are listed, but only abnormal results are displayed) Labs Reviewed - No data to display  EKG   Radiology No results found.  Procedures Procedures  (including critical care time)  Medications Ordered in UC Medications - No data to display  Initial Impression / Assessment and Plan / UC Course  I have reviewed the triage vital signs and the nursing notes.  Pertinent labs & imaging results that  were available during my care of the patient were reviewed by me and considered in my medical decision making (see chart for details).   Patient is a nontoxic-appearing 48 year old female presenting for evaluation to 3 word from the respiratory symptoms as outlined HPI above.  Her physical exam does reveal inflammation of her operatory tract with bloody yellow mucus in both nares.  Her sinuses are nontender to compression.  Cardiopulmonary exam is benign.  Given that patient has been experiencing symptoms last 2 to 3 weeks a trial of antibiotics is warranted.  I will discharge her home with a diagnosis of URI with cough and congestion on Augmentin 875 twice daily for 7 days.  Atrovent nasal spray to help with the nasal congestion.  Tessalon Perles and Promethazine DM cough syrup for cough and congestion.  Return precautions reviewed.   Final Clinical Impressions(s) / UC Diagnoses   Final diagnoses:  URI with cough and congestion     Discharge Instructions      Take the Augmentin twice daily with food for 7 days for treatment of your upper respiratory infection.  Use over-the-counter Tylenol and/or ibuprofen according the package instructions as needed for any fever or pain.  Use the Atrovent nasal spray, 2 squirts in each nostril every 6 hours, as needed for runny nose and postnasal drip.  Use the Tessalon Perles every 8 hours during the day.  Take them with a small sip of water.  They may give you some numbness to the base of your tongue or a metallic taste in your mouth, this is normal.  Use the Promethazine DM cough syrup at bedtime for cough and congestion.  It will make you drowsy so do not take it during the day.  Return for  reevaluation or see your primary care provider for any new or worsening symptoms.      ED Prescriptions     Medication Sig Dispense Auth. Provider   amoxicillin-clavulanate (AUGMENTIN) 875-125 MG tablet Take 1 tablet by mouth every 12 (twelve) hours for 7 days. 14 tablet Becky Augusta, NP   benzonatate (TESSALON) 100 MG capsule Take 2 capsules (200 mg total) by mouth every 8 (eight) hours. 21 capsule Becky Augusta, NP   ipratropium (ATROVENT) 0.06 % nasal spray Place 2 sprays into both nostrils 4 (four) times daily. 15 mL Becky Augusta, NP   promethazine-dextromethorphan (PROMETHAZINE-DM) 6.25-15 MG/5ML syrup Take 5 mLs by mouth 4 (four) times daily as needed. 118 mL Becky Augusta, NP      PDMP not reviewed this encounter.   Becky Augusta, NP 01/28/24 1101
